# Patient Record
Sex: Female | Born: 1995
Health system: Southern US, Community
[De-identification: ages and names within clinical notes are randomized; demographics above are authoritative.]

## PROBLEM LIST (undated history)

## (undated) DIAGNOSIS — T7840XA Allergy, unspecified, initial encounter: Secondary | ICD-10-CM

## (undated) DIAGNOSIS — F419 Anxiety disorder, unspecified: Secondary | ICD-10-CM

## (undated) HISTORY — DX: Allergy, unspecified, initial encounter: T78.40XA

## (undated) HISTORY — DX: Anxiety disorder, unspecified: F41.9

---
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
## 2016-05-13 DIAGNOSIS — D224 Melanocytic nevi of scalp and neck: Secondary | ICD-10-CM | POA: Diagnosis not present

## 2016-05-13 DIAGNOSIS — D225 Melanocytic nevi of trunk: Secondary | ICD-10-CM | POA: Diagnosis not present

## 2016-06-30 DIAGNOSIS — Z0000 Encounter for general adult medical examination without abnormal findings: Secondary | ICD-10-CM | POA: Diagnosis not present

## 2016-06-30 DIAGNOSIS — Z68.41 Body mass index (BMI) pediatric, 5th percentile to less than 85th percentile for age: Secondary | ICD-10-CM | POA: Diagnosis not present

## 2016-06-30 DIAGNOSIS — Z1389 Encounter for screening for other disorder: Secondary | ICD-10-CM | POA: Diagnosis not present

## 2016-06-30 DIAGNOSIS — Z23 Encounter for immunization: Secondary | ICD-10-CM | POA: Diagnosis not present

## 2016-08-24 DIAGNOSIS — M545 Low back pain: Secondary | ICD-10-CM | POA: Diagnosis not present

## 2016-11-17 DIAGNOSIS — F422 Mixed obsessional thoughts and acts: Secondary | ICD-10-CM | POA: Diagnosis not present

## 2016-11-19 DIAGNOSIS — H1013 Acute atopic conjunctivitis, bilateral: Secondary | ICD-10-CM | POA: Diagnosis not present

## 2016-11-27 DIAGNOSIS — F422 Mixed obsessional thoughts and acts: Secondary | ICD-10-CM | POA: Diagnosis not present

## 2016-12-15 DIAGNOSIS — F422 Mixed obsessional thoughts and acts: Secondary | ICD-10-CM | POA: Diagnosis not present

## 2016-12-16 ENCOUNTER — Ambulatory Visit (INDEPENDENT_AMBULATORY_CARE_PROVIDER_SITE_OTHER): Payer: BLUE CROSS/BLUE SHIELD | Admitting: Family Medicine

## 2016-12-16 ENCOUNTER — Encounter: Payer: Self-pay | Admitting: Family Medicine

## 2016-12-16 VITALS — BP 112/69 | HR 89 | Temp 97.4°F | Ht 65.0 in | Wt 112.0 lb

## 2016-12-16 DIAGNOSIS — L239 Allergic contact dermatitis, unspecified cause: Secondary | ICD-10-CM

## 2016-12-16 MED ORDER — MOMETASONE FUROATE 0.1 % EX CREA: 1.0000 "application " | TOPICAL_CREAM | Freq: Every day | CUTANEOUS | 0 refills | Status: DC

## 2016-12-16 MED ORDER — FEXOFENADINE HCL 180 MG PO TABS: ORAL_TABLET | ORAL | 11 refills | Status: DC

## 2016-12-16 NOTE — Progress Notes (Signed)
Subjective:  Patient ID: Chelsea Fleming, female    DOB: Dec 02, 1995  Age: 21 y.o. MRN: [id number]  CC: New Patient (Initial Visit) (pt here today to establish care. She is also c/o red, itchy and puffy eyes.)   HPI Chelsea Fleming presents for Symptoms noted above. They have been present for 2-3 days. It has not caused any change in visual acuity. The itching has been very uncomfortable. It is primarily present below the left eye.  History Chelsea Fleming has a past medical history of Allergy and Anxiety.   She has no past surgical history on file.   Her family history includes Depression in her mother; Diabetes in her paternal grandfather; Heart disease in her maternal grandfather and paternal grandfather; Hyperlipidemia in her mother.She reports that she has never smoked. She has never used smokeless tobacco. She reports that she does not drink alcohol or use drugs.  No current outpatient prescriptions on file prior to visit.   No current facility-administered medications on file prior to visit.     ROS Review of Systems  Constitutional: Negative for appetite change, chills, diaphoresis, fatigue and fever.  HENT: Negative for congestion, ear pain, hearing loss, postnasal drip, rhinorrhea, sore throat and trouble swallowing.   Eyes: Positive for discharge, redness and itching.  Respiratory: Negative for cough, chest tightness and shortness of breath.   Cardiovascular: Negative for chest pain and palpitations.  Gastrointestinal: Negative for abdominal pain.  Musculoskeletal: Negative for arthralgias.  Skin: Negative for rash.    Objective:  BP 112/69   Pulse 89   Temp 97.4 F (36.3 C) (Oral)   Ht 5\' 5"  (1.651 m)   Wt 112 lb (50.8 kg)   LMP 12/15/2016 (Exact Date)   BMI 18.64 kg/m   Physical Exam  Constitutional: She is oriented to person, place, and time. She appears well-developed and well-nourished. No distress.  Eyes: Right eye exhibits chemosis. Right eye exhibits no exudate.  Left eye exhibits chemosis. Left eye exhibits no exudate. Right conjunctiva is injected. Left conjunctiva is injected.  Cardiovascular: Normal rate and regular rhythm.   Pulmonary/Chest: Breath sounds normal.  Neurological: She is alert and oriented to person, place, and time.  Skin: Skin is warm and dry.  Psychiatric: She has a normal mood and affect.    Assessment & Plan:   Chelsea Fleming was seen today for new patient (initial visit).  Diagnoses and all orders for this visit:  Allergic eczema -     Ambulatory referral to Allergy  Other orders -     fexofenadine (ALLEGRA) 180 MG tablet; One daily to twice daily For allergy symptoms -     mometasone (ELOCON) 0.1 % cream; Apply 1 application topically daily.   I am having [name] start on fexofenadine and mometasone. I am also having her maintain her Norgestimate-Ethinyl Estradiol Triphasic.  Meds ordered this encounter  Medications  . Norgestimate-Ethinyl Estradiol Triphasic (TRI-PREVIFEM) 0.18/0.215/0.25 MG-35 MCG tablet    Sig: TAKE 1 TABLET BY MOUTH EVERY DAY  . fexofenadine (ALLEGRA) 180 MG tablet    Sig: One daily to twice daily For allergy symptoms    Dispense:  60 tablet    Refill:  11  . mometasone (ELOCON) 0.1 % cream    Sig: Apply 1 application topically daily.    Dispense:  45 g    Refill:  0   Your main treatment should be the Allegra that is listed below, along with as needed use of the Elocon that has been prescribed.  Consider using Cetaphil products instead of the Vaseline around your eyes.  Follow-up: Return if symptoms worsen or fail to improve.  Chelsea Fleming, M.D.

## 2016-12-16 NOTE — Patient Instructions (Signed)
It was great to meet you today!  Your diagnosis today he was allergic eczema of the periorbital (around the eye) region.  Your main treatment should be the Allegra that is listed below, along with as needed use of the Elocon that has been prescribed.  Consider using Cetaphil products instead of the Vaseline around your eyes.  You have been referred to an allergist. Please give the office up to 1 week to make those arrangements for you.  Best regards,  Mechele ClaudeWarren Covey Baller M.D.

## 2016-12-26 DIAGNOSIS — F422 Mixed obsessional thoughts and acts: Secondary | ICD-10-CM | POA: Diagnosis not present

## 2017-01-12 DIAGNOSIS — F422 Mixed obsessional thoughts and acts: Secondary | ICD-10-CM | POA: Diagnosis not present

## 2017-01-21 ENCOUNTER — Ambulatory Visit: Payer: BLUE CROSS/BLUE SHIELD | Admitting: Allergy and Immunology

## 2017-01-26 DIAGNOSIS — F422 Mixed obsessional thoughts and acts: Secondary | ICD-10-CM | POA: Diagnosis not present

## 2017-04-05 ENCOUNTER — Telehealth: Payer: Self-pay | Admitting: Family Medicine

## 2017-04-05 NOTE — Telephone Encounter (Signed)
Copied shot records off ncir and put copy up front

## 2017-07-05 ENCOUNTER — Other Ambulatory Visit: Payer: Self-pay | Admitting: Family Medicine

## 2017-07-12 ENCOUNTER — Ambulatory Visit (INDEPENDENT_AMBULATORY_CARE_PROVIDER_SITE_OTHER): Payer: BLUE CROSS/BLUE SHIELD | Admitting: Family Medicine

## 2017-07-12 ENCOUNTER — Encounter: Payer: Self-pay | Admitting: Family Medicine

## 2017-07-12 VITALS — BP 115/68 | HR 69 | Temp 97.8°F | Ht 65.0 in | Wt 108.0 lb

## 2017-07-12 DIAGNOSIS — Z23 Encounter for immunization: Secondary | ICD-10-CM

## 2017-07-12 DIAGNOSIS — Z114 Encounter for screening for human immunodeficiency virus [HIV]: Secondary | ICD-10-CM | POA: Diagnosis not present

## 2017-07-12 DIAGNOSIS — Z130 Encounter for screening for diseases of the blood and blood-forming organs and certain disorders involving the immune mechanism: Secondary | ICD-10-CM | POA: Diagnosis not present

## 2017-07-12 DIAGNOSIS — Z0000 Encounter for general adult medical examination without abnormal findings: Secondary | ICD-10-CM

## 2017-07-12 DIAGNOSIS — Z5181 Encounter for therapeutic drug level monitoring: Secondary | ICD-10-CM | POA: Diagnosis not present

## 2017-07-12 DIAGNOSIS — Z681 Body mass index (BMI) 19 or less, adult: Secondary | ICD-10-CM | POA: Diagnosis not present

## 2017-07-12 DIAGNOSIS — Z3041 Encounter for surveillance of contraceptive pills: Secondary | ICD-10-CM | POA: Diagnosis not present

## 2017-07-12 LAB — PREGNANCY, URINE: PREG TEST UR: NEGATIVE

## 2017-07-12 MED ORDER — NORGESTIM-ETH ESTRAD TRIPHASIC 0.18/0.215/0.25 MG-35 MCG PO TABS: 1.0000 | ORAL_TABLET | Freq: Every day | ORAL | 4 refills | Status: DC

## 2017-07-12 NOTE — Patient Instructions (Signed)
Preventive Care for [place], Female The transition to life after high school as a young adult can be a stressful time with many changes. You may start seeing a primary care physician instead of a pediatrician. This is the time when your health care becomes your responsibility. Preventive care refers to lifestyle choices and visits with your health care provider that can promote health and wellness. What does preventive care include?  A yearly physical exam. This is also called an annual wellness visit.  Dental exams once or twice a year.  Routine eye exams. Ask your health care provider how often you should have your eyes checked.  Personal lifestyle choices, including: ? Daily care of your teeth and gums. ? Regular physical activity. ? Eating a healthy diet. ? Avoiding tobacco and drug use. ? Avoiding or limiting alcohol use. ? Practicing safe sex. ? Taking vitamin and mineral supplements as recommended by your health care provider. What happens during an annual wellness visit? Preventive care starts with a yearly visit to your primary care physician. The services and screenings done by your health care provider during your annual wellness visit will depend on your overall health, lifestyle risk factors, and family history of disease. Counseling Your health care provider may ask you questions about:  Past medical problems and your family's medical history.  Medicines or supplements you take.  Health insurance and access to health care.  Alcohol, tobacco, and drug use.  Your safety at home, work, or school.  Access to firearms.  Emotional well-being and how you cope with stress.  Relationship well-being.  Diet, exercise, and sleep habits.  Your sexual health and activity.  Your methods of birth control.  Your menstrual cycle.  Your pregnancy history.  Screening You may have the following tests or measurements:  Height, weight, and BMI.  Blood  pressure.  Lipid and cholesterol levels.  Tuberculosis skin test.  Skin exam.  Vision and hearing tests.  Screening test for hepatitis.  Screening tests for sexually transmitted diseases (STDs), if you are at risk.  BRCA-related cancer screening. This may be done if you have a family history of breast, ovarian, tubal, or peritoneal cancers.  Pelvic exam and Pap test. This may be done every 3 years starting at age 35.  Vaccines Your health care provider may recommend certain vaccines, such as:  Influenza vaccine. This is recommended every year.  Tetanus, diphtheria, and acellular pertussis (Tdap, Td) vaccine. You may need a Td booster every 10 years.  Varicella vaccine. You may need this if you have not been vaccinated.  HPV vaccine. If you are 25 or younger, you may need three doses over 6 months.  Measles, mumps, and rubella (MMR) vaccine. You may need at least one dose of MMR. You may also need a second dose.  Pneumococcal 13-valent conjugate (PCV13) vaccine. You may need this if you have certain conditions and were not previously vaccinated.  Pneumococcal polysaccharide (PPSV23) vaccine. You may need one or two doses if you smoke cigarettes or if you have certain conditions.  Meningococcal vaccine. One dose is recommended if you are age 24-21 years and a first-year college student living in a residence hall, or if you have one of several medical conditions. You may also need additional booster doses.  Hepatitis A vaccine. You may need this if you have certain conditions or if you travel or work in places where you may be exposed to hepatitis A.  Hepatitis B vaccine. You may need this if  you have certain conditions or if you travel or work in places where you may be exposed to hepatitis B.  Haemophilus influenzae type b (Hib) vaccine. You may need this if you have certain risk factors.  Talk to your health care provider about which screenings and vaccines you need and how  often you need them. What steps can I take to develop healthy behaviors?  Have regular preventive health care visits with your primary care physician and dentist.  Eat a healthy diet.  Drink enough fluid to keep your urine clear or pale yellow.  Stay active. Exercise at least 30 minutes 5 or more days of the week.  Use alcohol responsibly.  Maintain a healthy weight.  Do not use any products that contain nicotine, such as cigarettes, chewing tobacco, and e-cigarettes. If you need help quitting, ask your health care provider.  Do not use drugs.  Practice safe sex.  Use birth control (contraception) to prevent unwanted pregnancy. If you plan to become pregnant, see your health care provider for a pre-conception visit.  Find healthy ways to manage stress. How can I protect myself from injury? Injuries from violence or accidents are the leading cause of death among young adults and can often be prevented. Take these steps to help protect yourself:  Always wear your seat belt while driving or riding in a vehicle.  Do not drive if you have been drinking alcohol. Do not ride with someone who has been drinking.  Do not drive when you are tired or distracted. Do not text while driving.  Wear a helmet and other protective equipment during sports activities.  If you have firearms in your house, make sure you follow all gun safety procedures.  Seek help if you have been bullied, physically abused, or sexually abused.  Use the Internet responsibly to avoid dangers such as online bullying and online sexual predators.  What can I do to cope with stress? Young adults may face many new challenges that can be stressful, such as finding a job, going to college, moving away from home, managing money, being in a relationship, getting married, and having children. To manage stress:  Avoid known stressful situations when you can.  Exercise regularly.  Find a stress-reducing activity that  works best for you. Examples include meditation, yoga, listening to music, or reading.  Spend time in nature.  Keep a journal to write about your stress and how you respond.  Talk to your health care provider about stress. He or she may suggest counseling.  Spend time with supportive friends or family.  Do not cope with stress by: ? Drinking alcohol or using drugs. ? Smoking cigarettes. ? Eating.  Where can I get more information? Learn more about preventive care and healthy habits from:  [place] and Gynecologists: KaraokeExchange.nl  U.S. Probation officer Task Force: StageSync.si  National Adolescent and [place]: StrategicRoad.nl  American Academy of Pediatrics Bright Futures: https://brightfutures.MemberVerification.co.za  Society for Adolescent Health and Medicine: MoralBlog.co.za.aspx  PodExchange.nl: ToyLending.fr  This information is not intended to replace advice given to you by your health care provider. Make sure you discuss any questions you have with your health care provider. Document Released: 02/06/2016 Document Revised: 02/27/2016 Document Reviewed: 02/06/2016 Elsevier Interactive Patient Education  2017 [employer].

## 2017-07-12 NOTE — Assessment & Plan Note (Addendum)
Patient does have a history of OCD behavior. She is seeing a therapist. She admits to being a peak eater on exam today. We'll obtain a vitamin D level, complete metabolic panel, and TSH. We'll call with results. I encouraged healthy lifestyle choices, including balanced diet and regular exercise.

## 2017-07-12 NOTE — Progress Notes (Signed)
Chelsea Fleming is a 21 y.o. female presents to office today for annual physical exam examination.    Concerns today include: 1. Contraception Previous contraception: Tri-PreviFem.  LMP 07/01/17.  Last unprotected intercourse 07/12/17. No h/o STIs.  No vaginal discharge, abnormal vaginal bleeding, dyspareunia, fevers, chills, abdominal pain, dysuria.  No h/o DVT/ PE, clotting disorders, migraine headache.  Non smoker.  Occupation: Corporate treasurer, Marital status: boyfriend, longeterm, Substance use: none Diet: picky eater, takes a MVI. Exercise: regular Last eye exam: >1 year Last dental exam: q6 months Refills needed today: OCPs Immunizations needed: Flu Vaccine: yes ; TDap within the last 10 years  Past Medical History:  Diagnosis Date  . Allergy   . Anxiety    OCD-seeing counselor in [employer]   Social History   Social History  . Marital status: Single    Spouse name: N/A  . Number of children: N/A  . Years of education: N/A   Occupational History  . Not on file.   Social History Main Topics  . Smoking status: Never Smoker  . Smokeless tobacco: Never Used  . Alcohol use No  . Drug use: No  . Sexual activity: Yes    Partners: Male    Birth control/ protection: Pill, Condom   Other Topics Concern  . Not on file   Social History Narrative  . No narrative on file   History reviewed. No pertinent surgical history. Family History  Problem Relation Age of Onset  . Depression Mother   . Hyperlipidemia Mother   . Hypertension Mother   . Depression Sister   . Heart disease Maternal Grandfather   . Heart attack Maternal Grandfather 59  . Diabetes Paternal Grandfather   . Heart disease Paternal Grandfather   . Hypertension Paternal Grandfather     Current Outpatient Prescriptions:  .  fexofenadine (ALLEGRA) 180 MG tablet, One daily to twice daily For allergy symptoms, Disp: 60 tablet, Rfl: 11 .  mometasone (ELOCON) 0.1 % cream, Apply 1 application  topically daily., Disp: 45 g, Rfl: 0 .  Multiple Vitamin (MULTIVITAMIN WITH MINERALS) TABS tablet, Take 1 tablet by mouth daily., Disp: , Rfl:  .  Norgestimate-Ethinyl Estradiol Triphasic (TRI-PREVIFEM) 0.18/0.215/0.25 MG-35 MCG tablet, Take 1 tablet by mouth daily., Disp: 3 Package, Rfl: 4   ROS: Review of Systems Constitutional: negative Eyes: negative Ears, nose, mouth, throat, and face: positive for seasonal allergies Respiratory: negative Cardiovascular: negative Gastrointestinal: negative Genitourinary:negative Integument/breast: negative Hematologic/lymphatic: negative Musculoskeletal:negative Neurological: negative Behavioral/Psych: positive for anxiety and OCD behavior.  She sees a therapist for this. Endocrine: negative Allergic/Immunologic: positive for seasonal allergies, worst in the winter    Physical exam BP 115/68   Pulse 69   Temp 97.8 F (36.6 C) (Oral)   Ht '5\' 5"'$  (1.651 m)   Wt 108 lb (49 kg)   LMP 06/29/2017   BMI 17.97 kg/m  General appearance: alert, cooperative, appears stated age, no distress and thin Head: Normocephalic, without obvious abnormality, atraumatic Eyes: negative findings: lids and lashes normal, conjunctivae and sclerae normal, corneas clear and pupils equal, round, reactive to light and accomodation Ears: normal TM's and external ear canals both ears Nose: Nares normal. Septum midline. Mucosa normal. No drainage or sinus tenderness. Throat: lips, mucosa, and tongue normal; teeth and gums normal Neck: no adenopathy, no JVD, supple, symmetrical, trachea midline and thyroid not enlarged, symmetric, no tenderness/mass/nodules Back: symmetric, no curvature. ROM normal. No CVA tenderness. Lungs: clear to auscultation bilaterally Heart: regular rate and rhythm, S1,  S2 normal, no murmur, click, rub or gallop Abdomen: soft, non-tender; bowel sounds normal; no masses,  no organomegaly Extremities: extremities normal, atraumatic, no cyanosis or  edema Pulses: 2+ and symmetric Skin: Skin color, texture, turgor normal. No rashes or lesions Lymph nodes: Cervical, supraclavicular, and axillary nodes normal. Neurologic: Alert and oriented X 3, normal strength and tone. Normal symmetric reflexes. Normal coordination and gait    Assessment/ Plan: Chelsea Fleming here for annual physical exam.   BMI less than 19,adult Patient does have a history of OCD behavior. She is seeing a therapist. She admits to being a peak eater on exam today. We'll obtain a vitamin D level, complete metabolic panel, and TSH. We'll call with results. I encouraged healthy lifestyle choices, including balanced diet and regular exercise.  Encounter for surveillance of contraceptive pills Urine pregnancy negative. Refills provided for 1 year for oral contraceptive pills. We did review other forms of birth control. Patient may be interested in potentially having a LARC placed. She will follow up as needed regarding this. - Pregnancy, urine - GC/Chlamydia Probe Amp  Annual physical exam Influenza vaccine updated today. She will be due for her first Pap smear next year. Complete metabolic panel, CBC, screening for HIV at low risk patient, screening for GC/CT, vitamin D and TSH ordered today.  Medication monitoring encounter - CMP14+EGFR  Screening for HIV without presence of risk factors - HIV antibody (with reflex)  Screening for deficiency anemia - CBC  Counseled on healthy lifestyle choices, including diet (rich in fruits, vegetables and lean meats and low in salt and simple carbohydrates) and exercise (at least 30 minutes of moderate physical activity daily).  Patient to follow up in 1 year for annual exam or sooner if needed.  Chelsea Vickroy M. Lajuana Ripple, DO

## 2017-07-13 LAB — CMP14+EGFR
A/G RATIO: 1.6 (ref 1.2–2.2)
ALT: 17 IU/L (ref 0–32)
AST: 19 IU/L (ref 0–40)
Albumin: 4.6 g/dL (ref 3.5–5.5)
Alkaline Phosphatase: 45 IU/L (ref 39–117)
BUN/Creatinine Ratio: 12 (ref 9–23)
BUN: 9 mg/dL (ref 6–20)
Bilirubin Total: 0.6 mg/dL (ref 0.0–1.2)
CALCIUM: 9.5 mg/dL (ref 8.7–10.2)
CO2: 23 mmol/L (ref 20–29)
CREATININE: 0.78 mg/dL (ref 0.57–1.00)
Chloride: 101 mmol/L (ref 96–106)
GFR, EST AFRICAN AMERICAN: 127 mL/min/{1.73_m2} (ref >59)
GFR, EST NON AFRICAN AMERICAN: 110 mL/min/{1.73_m2} (ref >59)
Globulin, Total: 2.9 g/dL (ref 1.5–4.5)
Glucose: 59 mg/dL — ABNORMAL LOW (ref 65–99)
POTASSIUM: 4.4 mmol/L (ref 3.5–5.2)
Sodium: 139 mmol/L (ref 134–144)
TOTAL PROTEIN: 7.5 g/dL (ref 6.0–8.5)

## 2017-07-13 LAB — CBC
HEMOGLOBIN: 13.7 g/dL (ref 11.1–15.9)
Hematocrit: 39.9 % (ref 34.0–46.6)
MCH: 29.7 pg (ref 26.6–33.0)
MCHC: 34.3 g/dL (ref 31.5–35.7)
MCV: 86 fL (ref 79–97)
Platelets: 252 10*3/uL (ref 150–379)
RBC: 4.62 x10E6/uL (ref 3.77–5.28)
RDW: 12.6 % (ref 12.3–15.4)
WBC: 4.3 10*3/uL (ref 3.4–10.8)

## 2017-07-13 LAB — HIV ANTIBODY (ROUTINE TESTING W REFLEX): HIV Screen 4th Generation wRfx: NONREACTIVE

## 2017-07-13 LAB — TSH: TSH: 1.36 u[IU]/mL (ref 0.450–4.500)

## 2017-07-13 LAB — VITAMIN D 25 HYDROXY (VIT D DEFICIENCY, FRACTURES): VIT D 25 HYDROXY: 59.9 ng/mL (ref 30.0–100.0)

## 2017-07-14 LAB — GC/CHLAMYDIA PROBE AMP
Chlamydia trachomatis, NAA: NEGATIVE
Neisseria gonorrhoeae by PCR: NEGATIVE

## 2017-08-18 ENCOUNTER — Telehealth: Payer: Self-pay | Admitting: Family Medicine

## 2017-08-18 NOTE — Telephone Encounter (Signed)
Pt had all four Wisdom teeth pulled  on 11/9. Was put to sleep. The second and third night she was sick on stomach from hydrocodone. So she stopped that. She was put on clindamycin and sudafed. She states she felt very shake and out of it . She states she cant feel head or the back of her neck. She has already called the surgeon and they have not got back with her. Please advise.

## 2017-08-18 NOTE — Telephone Encounter (Signed)
Pt notified of recommendation Surgeon has contacted pt

## 2017-08-18 NOTE — Telephone Encounter (Signed)
Keep calliing her oral surgeon

## 2017-08-25 ENCOUNTER — Ambulatory Visit (INDEPENDENT_AMBULATORY_CARE_PROVIDER_SITE_OTHER): Payer: BLUE CROSS/BLUE SHIELD | Admitting: Family Medicine

## 2017-08-25 ENCOUNTER — Encounter: Payer: Self-pay | Admitting: Family Medicine

## 2017-08-25 VITALS — BP 130/74 | HR 78 | Temp 98.9°F | Ht 65.0 in | Wt 107.0 lb

## 2017-08-25 DIAGNOSIS — F422 Mixed obsessional thoughts and acts: Secondary | ICD-10-CM | POA: Diagnosis not present

## 2017-08-25 DIAGNOSIS — F411 Generalized anxiety disorder: Secondary | ICD-10-CM

## 2017-08-25 MED ORDER — SERTRALINE HCL 50 MG PO TABS: 50.0000 mg | ORAL_TABLET | Freq: Every day | ORAL | 0 refills | Status: DC

## 2017-08-25 MED ORDER — HYDROXYZINE HCL 50 MG PO TABS: 25.0000 mg | ORAL_TABLET | Freq: Three times a day (TID) | ORAL | 0 refills | Status: DC | PRN

## 2017-08-25 NOTE — Progress Notes (Signed)
Subjective: CC: OCD PCP: Mechele ClaudeStacks, Warren, MD ZOX:WRUEAVHPI:Chelsea Fleming is a 21 y.o. female presenting to clinic today for:  1. OCD/ Panic attacks Patient reports that she was diagnosed with obsessive-compulsive disorder around age 89 by her family physician.  She reports that she was prescribed medications for this at that age but never took them.  She describes obsessive-compulsive actions as repetitive praying, especially during stressful situations.  She notes that she actually had physical manifestations of obsessive-compulsive disorder prior to undergoing cognitive behavioral therapy with a provider in [place].  She notes last visit with that provider was this summer.  She found this to be extremely helpful.  Prior to cognitive behavioral therapy she was excessively handwashing, having to turn doorknobs a certain way, having to be dressed and wear her watch in certain ways., having to do things in certain orders.  She notes that when she graduated high school and went off to [place] that symptoms seem to have gotten worse.  She was only able to stay at college for 1 month before having to leave due to panic attacks which she describes as chest tightening and feeling short of breath.  She is planning to go to [employer] [place] in January and is worried that she will start having recurrent panic attacks and worsening of her obsessive compulsive disorder.  She is wanting to initiate medications in anticipation for a more stressful environment.  She notes a family history of possible bipolar depression in her mother who required psychiatric admission twice in her lifetime.  She also notes her sister has anxiety disorder and is well controlled on Zoloft and BuSpar.  She is somewhat worried about taking medications like Zoloft because they caused some weight gain and her sister.  She denies suicidal ideation, homicidal ideation, depressive symptoms, visual hallucinations.  She has never had to be hospitalized for  mental health disorder.  No history of substance use or abuse.  Allergies  Allergen Reactions  . Penicillin G Rash   Past Medical History:  Diagnosis Date  . Allergy   . Anxiety    OCD-seeing counselor in [place]   Family History  Problem Relation Age of Onset  . Depression Mother   . Hyperlipidemia Mother   . Hypertension Mother   . Depression Sister   . Heart disease Maternal Grandfather   . Heart attack Maternal Grandfather 59  . Diabetes Paternal Grandfather   . Heart disease Paternal Grandfather   . Hypertension Paternal Grandfather     Current Outpatient Medications:  .  fexofenadine (ALLEGRA) 180 MG tablet, One daily to twice daily For allergy symptoms, Disp: 60 tablet, Rfl: 11 .  mometasone (ELOCON) 0.1 % cream, Apply 1 application topically daily., Disp: 45 g, Rfl: 0 .  Multiple Vitamin (MULTIVITAMIN WITH MINERALS) TABS tablet, Take 1 tablet by mouth daily., Disp: , Rfl:  .  Norgestimate-Ethinyl Estradiol Triphasic (TRI-PREVIFEM) 0.18/0.215/0.25 MG-35 MCG tablet, Take 1 tablet by mouth daily., Disp: 3 Package, Rfl: 4  Social Hx: non smoker.  ROS: Per HPI  Objective: Office vital signs reviewed. BP 130/74   Pulse 78   Temp 98.9 F (37.2 C) (Oral)   Ht 5\' 5"  (1.651 m)   Wt 107 lb (48.5 kg)   BMI 17.81 kg/m   Physical Examination:  General: Awake, alert, thin female, No acute distress HEENT: Sclera white, MMM Cardio: regular rate and rhythm, S1S2 heard, no murmurs appreciated Pulm: clear to auscultation bilaterally, no wheezes, rhonchi or rales; normal work of  breathing on room air Psych: Mood stable, speech normal, affect appropriate, good eye contact, pleasant, does not appear to be responding to internal stimuli.  [facility] 08/25/2017 12/16/2016  Decreased Interest 0 0  Down, Depressed, Hopeless 0 0  PHQ - 2 Score 0 0   GAD 7 : Generalized Anxiety Score 08/25/2017  Nervous, Anxious, on Edge 3  Control/stop worrying 1    Worry too much - different things 1  Trouble relaxing 1  Restless 1  Easily annoyed or irritable 3  Afraid - awful might happen 3  Total GAD 7 Score 13  Anxiety Difficulty Somewhat difficult   Mood questionnaire:  Part 1: Patient answered yes to questions 1,2,6,7 Part 2: Patient answered no Part 3: Patient answered minor problem Part 4: Patient answered yes Part 5: Patient answered no  Assessment/ Plan: 21 y.o. female   Mixed obsessional thoughts and acts Diagnosed as a child by primary care doctor.  She is done well with cognitive behavioral therapy and I encouraged her to establish with a therapist on campus as soon as she arrives.  I think that she would be better served by having a therapist on campus rather than having to drive back home for sessions.  Given her sister's good response to Zoloft, I did recommend that she consider starting this medication for OCD and generalized anxiety disorder.  Patient is slightly underweight so I think that any weight gain would likely be insignificant.  Should she want to pursue an alternative, we did discuss Prozac today.  Her PHQ 9 score was 0 today.  Her gad 7 score was 13.  Her mood questionnaire was negative for evidence of bipolar or mood disorder.  I have also prescribed her hydroxyzine.  She may take 25-50 mg of this every 8 hours as needed for panic attack.  Given her rare panic attacks, she will likely not have to use this very often.  I did caution sedation with this medication.  Handout was provided.  She will come back for reevaluation in about 3 weeks.  We will talk about titration of medication at that time if needed. Strict return precautions and reasons for emergent evaluation in the emergency department review with patient.  They voiced understanding and will follow-up as needed.   GAD (generalized anxiety disorder) Initiate SSRI, patient has been prescribed hydroxyzine as needed, and she will establish with a therapist on campus at  college.  Gad 7 score 13 today.  Follow up in 1 month   Meds ordered this encounter  Medications  . sertraline (ZOLOFT) 50 MG tablet    Sig: Take 1 tablet (50 mg total) by mouth daily.    Dispense:  30 tablet    Refill:  0  . hydrOXYzine (ATARAX/VISTARIL) 50 MG tablet    Sig: Take 0.5-1 tablets (25-50 mg total) by mouth 3 (three) times daily as needed for anxiety.    Dispense:  30 tablet    Refill:  0   Total time spent with patient 29 minutes.  Greater than 50% of encounter spent in coordination of care/counseling.   Raliegh IpAshly M Bonnita Newby, DO [facility] [phone]

## 2017-08-25 NOTE — Patient Instructions (Addendum)
I have sent you an Zoloft 50 mg to take once daily.  The most common side effect of Zoloft is GI disturbance. I have also prescribed hydroxyzine.  You may take half a tablet to one full tablet up to 3 times daily if needed for panic attacks.  As we discussed, the hydroxyzine can cause sedation and dry mouth.  Taking the medicine as directed and not missing any doses is one of the best things you can do to treat your depression.  Here are some things to keep in mind:  1) Side effects (stomach upset, some increased anxiety) may happen before you notice a benefit.  These side effects typically go away over time. 2) Changes to your dose of medicine or a change in medication all together is sometimes necessary 3) Most people need to be on medication at least 12 months 4) Many people will notice an improvement within two weeks but the full effect of the medication can take up to 4-6 weeks 5) Stopping the medication when you start feeling better often results in a return of symptoms 6) Never discontinue your medication without contacting a health care professional first.  Some medications require gradual discontinuation/ taper and can make you sick if you stop them abruptly.  If your symptoms worsen or you have thoughts of suicide/homicide, PLEASE SEEK IMMEDIATE MEDICAL ATTENTION.  You may always call the National Suicide Hotline.  This is available 24 hours a day, 7 days a week.  Their phone number is: [phone]  Follow-up in 3-4 weeks with me.

## 2017-08-25 NOTE — Progress Notes (Signed)
o

## 2017-08-25 NOTE — Assessment & Plan Note (Signed)
Initiate SSRI, patient has been prescribed hydroxyzine as needed, and she will establish with a therapist on campus at college.  Gad 7 score 13 today.  Follow up in 1 month

## 2017-08-25 NOTE — Assessment & Plan Note (Signed)
Diagnosed as a child by primary care doctor.  She is done well with cognitive behavioral therapy and I encouraged her to establish with a therapist on campus as soon as she arrives.  I think that she would be better served by having a therapist on campus rather than having to drive back home for sessions.  Given her sister's good response to Zoloft, I did recommend that she consider starting this medication for OCD and generalized anxiety disorder.  Patient is slightly underweight so I think that any weight gain would likely be insignificant.  Should she want to pursue an alternative, we did discuss Prozac today.  Her PHQ 9 score was 0 today.  Her gad 7 score was 13.  Her mood questionnaire was negative for evidence of bipolar or mood disorder.  I have also prescribed her hydroxyzine.  She may take 25-50 mg of this every 8 hours as needed for panic attack.  Given her rare panic attacks, she will likely not have to use this very often.  I did caution sedation with this medication.  Handout was provided.  She will come back for reevaluation in about 3 weeks.  We will talk about titration of medication at that time if needed. Strict return precautions and reasons for emergent evaluation in the emergency department review with patient.  They voiced understanding and will follow-up as needed.

## 2017-09-15 ENCOUNTER — Ambulatory Visit (INDEPENDENT_AMBULATORY_CARE_PROVIDER_SITE_OTHER): Payer: BLUE CROSS/BLUE SHIELD | Admitting: Family Medicine

## 2017-09-15 ENCOUNTER — Encounter: Payer: Self-pay | Admitting: Family Medicine

## 2017-09-15 VITALS — BP 114/65 | HR 80 | Temp 98.1°F | Ht 65.0 in | Wt 112.0 lb

## 2017-09-15 DIAGNOSIS — F422 Mixed obsessional thoughts and acts: Secondary | ICD-10-CM | POA: Diagnosis not present

## 2017-09-15 DIAGNOSIS — F411 Generalized anxiety disorder: Secondary | ICD-10-CM

## 2017-09-15 MED ORDER — SERTRALINE HCL 50 MG PO TABS: 50.0000 mg | ORAL_TABLET | Freq: Every day | ORAL | 1 refills | Status: DC

## 2017-09-15 NOTE — Progress Notes (Signed)
Subjective: CC: follow up OCD/ GAD HPI: Chelsea Fleming is a 21 y.o. female presenting to clinic today for:  1. OCD/GAD Patient was seen August 25, 2017 for OCD/GAD.  She was started on Zoloft 50 mg daily and instructed to follow-up in 2-3 weeks for symptoms.  Today she reports that symptoms are dramatically better than last visit.  She notes that her mood is much more stable and she worries much less.  She does feel that medication does cause her to be a little bit sleepier than normal.  She specifically cites that she goes to bed around 8:00 now instead of 9:30.  She is otherwise tolerating medication really well.  Nausea has completely resolved.  She has not required the hydroxyzine at all for symptoms.  She will be starting at her new school in January.  She has not yet established with a therapist there but is planning to meet with the campus therapist when she arrives.  ROS: Per HPI  Past Medical History:  Diagnosis Date  . Allergy   . Anxiety    [place]   Allergies  Allergen Reactions  . Penicillin G Rash    Current Outpatient Medications:  .  fexofenadine (ALLEGRA) 180 MG tablet, One daily to twice daily For allergy symptoms, Disp: 60 tablet, Rfl: 11 .  hydrOXYzine (ATARAX/VISTARIL) 50 MG tablet, Take 0.5-1 tablets (25-50 mg total) by mouth 3 (three) times daily as needed for anxiety., Disp: 30 tablet, Rfl: 0 .  mometasone (ELOCON) 0.1 % cream, Apply 1 application topically daily., Disp: 45 g, Rfl: 0 .  Multiple Vitamin (MULTIVITAMIN WITH MINERALS) TABS tablet, Take 1 tablet by mouth daily., Disp: , Rfl:  .  Norgestimate-Ethinyl Estradiol Triphasic (TRI-PREVIFEM) 0.18/0.215/0.25 MG-35 MCG tablet, Take 1 tablet by mouth daily., Disp: 3 Package, Rfl: 4 .  sertraline (ZOLOFT) 50 MG tablet, Take 1 tablet (50 mg total) by mouth daily., Disp: 30 tablet, Rfl: 0 Social History   Socioeconomic History  . Marital status: Single    Spouse name: Not  on file  . Number of children: Not on file  . Years of education: Not on file  . Highest education level: Not on file  Social Needs  . Financial resource strain: Not on file  . Food insecurity - worry: Not on file  . Food insecurity - inability: Not on file  . Transportation needs - medical: Not on file  . Transportation needs - non-medical: Not on file  Occupational History  . Not on file  Tobacco Use  . Smoking status: Never Smoker  . Smokeless tobacco: Never Used  Substance and Sexual Activity  . Alcohol use: No  . Drug use: No  . Sexual activity: Yes    Partners: Male    Birth control/protection: Pill, Condom  Other Topics Concern  . Not on file  Social History Narrative  . Not on file   Family History  Problem Relation Age of Onset  . Depression Mother   . Hyperlipidemia Mother   . Hypertension Mother   . Depression Sister   . Heart disease Maternal Grandfather   . Heart attack Maternal Grandfather 59  . Diabetes Paternal Grandfather   . Heart disease Paternal Grandfather   . Hypertension Paternal Grandfather    Objective: Office vital signs reviewed. BP 114/65   Pulse 80   Temp 98.1 F (36.7 C) (Oral)   Ht 5\' 5"  (1.651 m)   Wt 112 lb (50.8 kg)   BMI  18.64 kg/m   Physical Examination:  General: Awake, alert, thin female, well appearing, No acute distress Psych: Mood stable, speech normal, affect appropriate, good eye contact, pleasant Depression screen [facility] 2/9 09/15/2017 08/25/2017 12/16/2016  Decreased Interest 0 0 0  Down, Depressed, Hopeless 0 0 0  PHQ - 2 Score 0 0 0   GAD 7 : Generalized Anxiety Score 09/15/2017 08/25/2017  Nervous, Anxious, on Edge 0 3  Control/stop worrying 0 1  Worry too much - different things 0 1  Trouble relaxing 0 1  Restless 0 1  Easily annoyed or irritable 1 3  Afraid - awful might happen 0 3  Total GAD 7 Score 1 13  Anxiety Difficulty - Somewhat difficult    Assessment/ Plan: 21 y.o. female   Mixed obsessional  thoughts and acts Subjectively doing much better than previous exam.  Continue Zoloft 50 mg.  I recommended that she consider pushing the dose closer to bedtime in efforts to reduce daytime sleepiness.  6 months of refills were prescribed today.  She will establish with a therapist on campus.  she will follow-up with me in the next 3-4 months.  GAD (generalized anxiety disorder) Greatly improved compared to last visit.  Her gad 7 score has gone from a 13 down to a 1.  Continue Zoloft 50 mg daily.   Raliegh IpAshly M Magic Mohler, DO [facility] [phone]

## 2017-09-15 NOTE — Assessment & Plan Note (Signed)
Greatly improved compared to last visit.  Her gad 7 score has gone from a 13 down to a 1.  Continue Zoloft 50 mg daily.

## 2017-09-15 NOTE — Assessment & Plan Note (Signed)
Subjectively doing much better than previous exam.  Continue Zoloft 50 mg.  I recommended that she consider pushing the dose closer to bedtime in efforts to reduce daytime sleepiness.  6 months of refills were prescribed today.  She will establish with a therapist on campus.  she will follow-up with me in the next 3-4 months.

## 2017-12-26 DIAGNOSIS — B349 Viral infection, unspecified: Secondary | ICD-10-CM | POA: Diagnosis not present

## 2018-02-03 ENCOUNTER — Encounter: Payer: Self-pay | Admitting: Family Medicine

## 2018-02-09 NOTE — Progress Notes (Signed)
Subjective: CC: GAD/ STI testing/ pap HPI: Chelsea Fleming is a 22 y.o. female presenting to clinic today for:  1.  GAD/OCD Patient last seen in December 2018 for generalized anxiety disorder and OCD.  She had reported good control of symptoms on Zoloft 50 mg daily.  She reported rare use of hydroxyzine.  She started a new school in January and notes that things have been going well at school.  She is currently studying public health and enjoying classes at [employer].  She notes that she did have to use hydroxyzine a couple of times when she first started but she has been doing well since that time.  She does have occasional nonbloody diarrhea related to stress but this always resolves.  2. STI testing/ pap smear Patient has never had a Pap smear before.  She is sexually active with 1 female partner.  Denies abnormal vaginal discharge, vaginal lesions, postcoital bleeding, pain with intercourse, vaginal itching, dysuria, hematuria.  She is currently menstruating.Denies History of STIs. Patient's last menstrual period was 02/11/2018 (exact date).  3. Low back pain Per patient's report this is a chronic issue for her.  Pain is nonradiating.  She notes that she had x-rays about 2 years ago of the low back which were unremarkable.  She was prescribed Naprosyn and Robaxin at that time, which helped.  She notes that she had a flare recently that resolved after getting a massage.  She is wondering if she should have Naprosyn on hand for future flares.  No red flag signs, including urinary retention, fecal incontinence or saddle anesthesia.  ROS: Per HPI  Past Medical History:  Diagnosis Date  . Allergy   . Anxiety    OCD-seeing counselor in [facility]   Allergies  Allergen Reactions  . Penicillin G Rash    Current Outpatient Medications:  .  fexofenadine (ALLEGRA) 180 MG tablet, One daily to twice daily For allergy symptoms, Disp: 60 tablet, Rfl: 11 .  hydrOXYzine (ATARAX/VISTARIL) 50 MG  tablet, Take 0.5-1 tablets (25-50 mg total) by mouth 3 (three) times daily as needed for anxiety., Disp: 30 tablet, Rfl: 0 .  mometasone (ELOCON) 0.1 % cream, Apply 1 application topically daily., Disp: 45 g, Rfl: 0 .  Multiple Vitamin (MULTIVITAMIN WITH MINERALS) TABS tablet, Take 1 tablet by mouth daily., Disp: , Rfl:  .  Norgestimate-Ethinyl Estradiol Triphasic (TRI-PREVIFEM) 0.18/0.215/0.25 MG-35 MCG tablet, Take 1 tablet by mouth daily., Disp: 3 Package, Rfl: 4 .  sertraline (ZOLOFT) 50 MG tablet, Take 1 tablet (50 mg total) by mouth daily., Disp: 90 tablet, Rfl: 1 Social History   Socioeconomic History  . Marital status: Single    Spouse name: Not on file  . Number of children: Not on file  . Years of education: Not on file  . Highest education level: Not on file  Occupational History  . Not on file  Social Needs  . Financial resource strain: Not on file  . Food insecurity:    Worry: Not on file    Inability: Not on file  . Transportation needs:    Medical: Not on file    Non-medical: Not on file  Tobacco Use  . Smoking status: Never Smoker  . Smokeless tobacco: Never Used  Substance and Sexual Activity  . Alcohol use: No  . Drug use: No  . Sexual activity: Yes    Partners: Male    Birth control/protection: Pill, Condom  Lifestyle  . Physical activity:    Days per  week: Not on file    Minutes per session: Not on file  . Stress: Not on file  Relationships  . Social connections:    Talks on phone: Not on file    Gets together: Not on file    Attends religious service: Not on file    Active member of club or organization: Not on file    Attends meetings of clubs or organizations: Not on file    Relationship status: Not on file  . Intimate partner violence:    Fear of current or ex partner: Not on file    Emotionally abused: Not on file    Physically abused: Not on file    Forced sexual activity: Not on file  Other Topics Concern  . Not on file  Social History  Narrative  . Not on file   Family History  Problem Relation Age of Onset  . Depression Mother   . Hyperlipidemia Mother   . Hypertension Mother   . Depression Sister   . Heart disease Maternal Grandfather   . Heart attack Maternal Grandfather 59  . Diabetes Paternal Grandfather   . Heart disease Paternal Grandfather   . Hypertension Paternal Grandfather     Health Maintenance: Td  Objective: Office vital signs reviewed. BP 106/66   Pulse (!) 59   Temp (!) 97.1 F (36.2 C) (Oral)   Ht  (1.6 m)   Wt 112 lb (50.8 kg)   LMP 02/11/2018 (Exact Date)   BMI 19.84 kg/m   Physical Examination:  General: Awake, alert, well appearing, No acute distress HEENT: Normal    Eyes: PERRLA, extraocular movement in tact, sclera white    Throat: moist mucus membranes Pulm: normal work of breathing on room air GU: external vaginal tissue normal, cervix retroverted and slightly to patient's right, no punctate lesions on cervix appreciated, moderate brown/bloody discharge from cervical os, no cervical motion tenderness, no abdominal/ adnexal masses MSK: normal gait and normal station Psych: Mood stable, speech normal, affect appropriate, pleasant, interactive.  Depression screen [facility] 2/9 02/11/2018 09/15/2017 08/25/2017  Decreased Interest 0 0 0  Down, Depressed, Hopeless 0 0 0  PHQ - 2 Score 0 0 0   GAD 7 : Generalized Anxiety Score 02/11/2018 09/15/2017 08/25/2017  Nervous, Anxious, on Edge 0 0 3  Control/stop worrying 0 0 1  Worry too much - different things 0 0 1  Trouble relaxing 0 0 1  Restless 0 0 1  Easily annoyed or irritable 0 1 3  Afraid - awful might happen 0 0 3  Total GAD 7 Score 0 1 13  Anxiety Difficulty Not difficult at all - Somewhat difficult    Assessment/ Plan: 22 y.o. female   GAD (generalized anxiety disorder) Well-controlled on Zoloft 50 mg and hydroxyzine as needed.  Her gad 7 score was 0.  Her PHQ 9 score was 0.  She is adjusting to her new school well.   I have sent in a full years worth of refills.  She will contact me if she has any needs.  Plan to see her around December.  Mixed obsessional thoughts and acts  Body mass index (BMI) 19.9 or less, adult  Screening for cervical cancer First Pap.  No previous for comparison.  Will check for STIs per patient's request as well.  HPV reflex if indicated.  Will contact the results once they are available. - Pap Lb, Ct-Ng TV rfx HPV ASCU  Chronic bilateral low back pain without sciatica  Intermittent in nature likely muscular in nature.  Not currently in flare.  Previously well controlled with naproxen 500 mg and Robaxin as needed.  I have given the patient a written prescription for naproxen to use as needed.  She will contact me if symptoms are uncontrolled.  Need for tetanus booster Td administered today.   Raliegh Ip, DO [facility] [phone]

## 2018-02-11 ENCOUNTER — Ambulatory Visit (INDEPENDENT_AMBULATORY_CARE_PROVIDER_SITE_OTHER): Payer: BLUE CROSS/BLUE SHIELD | Admitting: Family Medicine

## 2018-02-11 VITALS — BP 106/66 | HR 59 | Temp 97.1°F | Ht 63.0 in | Wt 112.0 lb

## 2018-02-11 DIAGNOSIS — M545 Low back pain, unspecified: Secondary | ICD-10-CM | POA: Insufficient documentation

## 2018-02-11 DIAGNOSIS — Z124 Encounter for screening for malignant neoplasm of cervix: Secondary | ICD-10-CM

## 2018-02-11 DIAGNOSIS — G8929 Other chronic pain: Secondary | ICD-10-CM

## 2018-02-11 DIAGNOSIS — Z681 Body mass index (BMI) 19 or less, adult: Secondary | ICD-10-CM

## 2018-02-11 DIAGNOSIS — F411 Generalized anxiety disorder: Secondary | ICD-10-CM | POA: Diagnosis not present

## 2018-02-11 DIAGNOSIS — F422 Mixed obsessional thoughts and acts: Secondary | ICD-10-CM | POA: Diagnosis not present

## 2018-02-11 DIAGNOSIS — Z113 Encounter for screening for infections with a predominantly sexual mode of transmission: Secondary | ICD-10-CM | POA: Diagnosis not present

## 2018-02-11 DIAGNOSIS — Z23 Encounter for immunization: Secondary | ICD-10-CM | POA: Diagnosis not present

## 2018-02-11 MED ORDER — NAPROXEN 500 MG PO TABS: 500.0000 mg | ORAL_TABLET | Freq: Two times a day (BID) | ORAL | 0 refills | Status: DC

## 2018-02-11 MED ORDER — SERTRALINE HCL 50 MG PO TABS: 50.0000 mg | ORAL_TABLET | Freq: Every day | ORAL | 4 refills | Status: DC

## 2018-02-11 NOTE — Patient Instructions (Signed)
Preventive Care for [place], Female The transition to life after high school as a young adult can be a stressful time with many changes. You may start seeing a primary care physician instead of a pediatrician. This is the time when your health care becomes your responsibility. Preventive care refers to lifestyle choices and visits with your health care provider that can promote health and wellness. What does preventive care include?  A yearly physical exam. This is also called an annual wellness visit.  Dental exams once or twice a year.  Routine eye exams. Ask your health care provider how often you should have your eyes checked.  Personal lifestyle choices, including: ? Daily care of your teeth and gums. ? Regular physical activity. ? Eating a healthy diet. ? Avoiding tobacco and drug use. ? Avoiding or limiting alcohol use. ? Practicing safe sex. ? Taking vitamin and mineral supplements as recommended by your health care provider. What happens during an annual wellness visit? Preventive care starts with a yearly visit to your primary care physician. The services and screenings done by your health care provider during your annual wellness visit will depend on your overall health, lifestyle risk factors, and family history of disease. Counseling Your health care provider may ask you questions about:  Past medical problems and your family's medical history.  Medicines or supplements you take.  Health insurance and access to health care.  Alcohol, tobacco, and drug use.  Your safety at home, work, or school.  Access to firearms.  Emotional well-being and how you cope with stress.  Relationship well-being.  Diet, exercise, and sleep habits.  Your sexual health and activity.  Your methods of birth control.  Your menstrual cycle.  Your pregnancy history.  Screening You may have the following tests or measurements:  Height, weight, and BMI.  Blood  pressure.  Lipid and cholesterol levels.  Tuberculosis skin test.  Skin exam.  Vision and hearing tests.  Screening test for hepatitis.  Screening tests for sexually transmitted diseases (STDs), if you are at risk.  BRCA-related cancer screening. This may be done if you have a family history of breast, ovarian, tubal, or peritoneal cancers.  Pelvic exam and Pap test. This may be done every 3 years starting at age 35.  Vaccines Your health care provider may recommend certain vaccines, such as:  Influenza vaccine. This is recommended every year.  Tetanus, diphtheria, and acellular pertussis (Tdap, Td) vaccine. You may need a Td booster every 10 years.  Varicella vaccine. You may need this if you have not been vaccinated.  HPV vaccine. If you are 25 or younger, you may need three doses over 6 months.  Measles, mumps, and rubella (MMR) vaccine. You may need at least one dose of MMR. You may also need a second dose.  Pneumococcal 13-valent conjugate (PCV13) vaccine. You may need this if you have certain conditions and were not previously vaccinated.  Pneumococcal polysaccharide (PPSV23) vaccine. You may need one or two doses if you smoke cigarettes or if you have certain conditions.  Meningococcal vaccine. One dose is recommended if you are age 24-21 years and a first-year college student living in a residence hall, or if you have one of several medical conditions. You may also need additional booster doses.  Hepatitis A vaccine. You may need this if you have certain conditions or if you travel or work in places where you may be exposed to hepatitis A.  Hepatitis B vaccine. You may need this if  you have certain conditions or if you travel or work in places where you may be exposed to hepatitis B.  Haemophilus influenzae type b (Hib) vaccine. You may need this if you have certain risk factors.  Talk to your health care provider about which screenings and vaccines you need and how  often you need them. What steps can I take to develop healthy behaviors?  Have regular preventive health care visits with your primary care physician and dentist.  Eat a healthy diet.  Drink enough fluid to keep your urine clear or pale yellow.  Stay active. Exercise at least 30 minutes 5 or more days of the week.  Use alcohol responsibly.  Maintain a healthy weight.  Do not use any products that contain nicotine, such as cigarettes, chewing tobacco, and e-cigarettes. If you need help quitting, ask your health care provider.  Do not use drugs.  Practice safe sex.  Use birth control (contraception) to prevent unwanted pregnancy. If you plan to become pregnant, see your health care provider for a pre-conception visit.  Find healthy ways to manage stress. How can I protect myself from injury? Injuries from violence or accidents are the leading cause of death among young adults and can often be prevented. Take these steps to help protect yourself:  Always wear your seat belt while driving or riding in a vehicle.  Do not drive if you have been drinking alcohol. Do not ride with someone who has been drinking.  Do not drive when you are tired or distracted. Do not text while driving.  Wear a helmet and other protective equipment during sports activities.  If you have firearms in your house, make sure you follow all gun safety procedures.  Seek help if you have been bullied, physically abused, or sexually abused.  Use the Internet responsibly to avoid dangers such as online bullying and online sexual predators.  What can I do to cope with stress? Young adults may face many new challenges that can be stressful, such as finding a job, going to college, moving away from home, managing money, being in a relationship, getting married, and having children. To manage stress:  Avoid known stressful situations when you can.  Exercise regularly.  Find a stress-reducing activity that  works best for you. Examples include meditation, yoga, listening to music, or reading.  Spend time in nature.  Keep a journal to write about your stress and how you respond.  Talk to your health care provider about stress. He or she may suggest counseling.  Spend time with supportive friends or family.  Do not cope with stress by: ? Drinking alcohol or using drugs. ? Smoking cigarettes. ? Eating.  Where can I get more information? Learn more about preventive care and healthy habits from:  [place] and Gynecologists: KaraokeExchange.nl  U.S. Probation officer Task Force: StageSync.si  National Adolescent and [place]: StrategicRoad.nl  American Academy of Pediatrics Bright Futures: https://brightfutures.MemberVerification.co.za  Society for Adolescent Health and Medicine: MoralBlog.co.za.aspx  PodExchange.nl: ToyLending.fr  This information is not intended to replace advice given to you by your health care provider. Make sure you discuss any questions you have with your health care provider. Document Released: 02/06/2016 Document Revised: 02/27/2016 Document Reviewed: 02/06/2016 Elsevier Interactive Patient Education  [employer].

## 2018-02-11 NOTE — Assessment & Plan Note (Addendum)
Intermittent in nature likely muscular in nature.  Not currently in flare.  Previously well controlled with naproxen 500 mg and Robaxin as needed.  I have given the patient a written prescription for naproxen to use as needed.  She will contact me if symptoms are uncontrolled.

## 2018-02-11 NOTE — Assessment & Plan Note (Signed)
Well-controlled on Zoloft 50 mg and hydroxyzine as needed.  Her gad 7 score was 0.  Her PHQ 9 score was 0.  She is adjusting to her new school well.  I have sent in a full years worth of refills.  She will contact me if she has any needs.  Plan to see her around December.

## 2018-02-15 LAB — PAP LB, CT-NG TV RFX HPV ASCU
Chlamydia, Nuc. Acid Amp: NEGATIVE
Gonococcus, Nuc. Acid Amp: NEGATIVE
PAP Smear Comment: 0
TRICH VAG BY NAA: NEGATIVE

## 2018-03-21 ENCOUNTER — Encounter: Payer: Self-pay | Admitting: Family Medicine

## 2018-03-21 ENCOUNTER — Ambulatory Visit (INDEPENDENT_AMBULATORY_CARE_PROVIDER_SITE_OTHER): Payer: BLUE CROSS/BLUE SHIELD | Admitting: Family Medicine

## 2018-03-21 VITALS — BP 114/74 | HR 82 | Temp 98.6°F | Ht 63.0 in | Wt 112.0 lb

## 2018-03-21 DIAGNOSIS — R3915 Urgency of urination: Secondary | ICD-10-CM | POA: Diagnosis not present

## 2018-03-21 DIAGNOSIS — R102 Pelvic and perineal pain: Secondary | ICD-10-CM | POA: Diagnosis not present

## 2018-03-21 LAB — URINALYSIS
BILIRUBIN UA: NEGATIVE
Glucose, UA: NEGATIVE
Ketones, UA: NEGATIVE
Leukocytes, UA: NEGATIVE
NITRITE UA: NEGATIVE
PH UA: 6.5 (ref 5.0–7.5)
PROTEIN UA: NEGATIVE
SPEC GRAV UA: 1.025 (ref 1.005–1.030)
UUROB: 0.2 mg/dL (ref 0.2–1.0)

## 2018-03-21 LAB — WET PREP FOR TRICH, YEAST, CLUE
Clue Cell Exam: NEGATIVE
Trichomonas Exam: NEGATIVE
Yeast Exam: NEGATIVE

## 2018-03-21 MED ORDER — BENZOCAINE (TOPICAL) 20 % EX AERO: INHALATION_SPRAY | CUTANEOUS | 0 refills | Status: DC

## 2018-03-21 MED ORDER — FLUTICASONE PROPIONATE 50 MCG/ACT NA SUSP: 2.0000 | Freq: Every day | NASAL | 6 refills | Status: DC

## 2018-03-21 NOTE — Patient Instructions (Addendum)
I will call you with results of your labs once they return.  For now, consider returning back to the other product we discussed.  I have sent in the hurricane spray like we discussed.  You may use this if needed for pain.   Healthy vaginal hygiene practices   -  Avoid sleeper pajamas. Nightgowns allow air to circulate.  Sleep without underpants whenever possible.  -  Wear cotton underpants during the day. Double-rinse underwear after washing to avoid residual irritants. Do not use fabric softeners for underwear and swimsuits.  - Avoid tights, leotards, leggings, "skinny" jeans, and other tight-fitting clothing. Skirts and loose-fitting pants allow air to circulate.  - Avoid pantyliners.  Instead use tampons or cotton pads.  - Daily warm bathing is helpful:     - Soak in clean water (no soap) for 10 to 15 minutes. Adding vinegar or baking soda to the water has not been specifically studied and may not be better than clean water alone.      - Use soap to wash regions other than the genital area just before getting out of the tub. Limit use of any soap on genital areas. Use fragance-free soaps.     - Rinse the genital area well and gently pat dry.  Don't rub.  Hair dryer to assist with drying can be used only if on cool setting.     - Do not use bubble baths or perfumed soaps.  - Do not use any feminine sprays, douches or powders.  These contain chemicals that will irritate the skin.  - If the genital area is tender or swollen, cool compresses may relieve the discomfort. Unscented wet wipes can be used instead of toilet paper for wiping.   - Emollients, such as Vaseline, may help protect skin and can be applied to the irritated area.  - Always remember to wipe front-to-back after bowel movements. Pat dry after urination.  - Do not sit in wet swimsuits for long periods of time after swimming

## 2018-03-21 NOTE — Progress Notes (Signed)
Subjective: CC: vaginal pain PCP: Raliegh Ip, DO ZOX:WRUEAV [name] is a 22 y.o. female presenting to clinic today for:  1. Vaginal Pain Patient reports that vaginal pain started around 2:00 this morning.  She notes that the pain was throbbing and severe.  She took ibuprofen and use an ice pack but this did not help much.  She was able to get back to sleep but notes that she continued to have some vaginal pain this morning.  Pain seems to have gotten better now that she is moving around.  She denies any abnormal vaginal discharge, vaginal odors.  She reports dyspareunia that is been ongoing for about 1 month now.  She denies vaginal pruritis, abnormal vaginal bleeding, dysuria, hematuria, nausea, vomiting, fevers, post coital bleeding, sore throat, rashes.  She does report some urinary urgency.  Negative history of STDs.  She is sexually active and does use condoms with each intercourse.  She notes that she did change lubricants from K-Y jelly to Astra glide recently.  She also gets genital laser hair removal regularly.  Last unprotected intercourse n/a.  Contraception: OCPs.  Patient's last menstrual period was 03/07/2018.  ROS: Per HPI  Allergies  Allergen Reactions  . Penicillin G Rash   Past Medical History:  Diagnosis Date  . Allergy   . Anxiety    OCD-seeing counselor in [facility]    Current Outpatient Medications:  .  fexofenadine (ALLEGRA) 180 MG tablet, One daily to twice daily For allergy symptoms, Disp: 60 tablet, Rfl: 11 .  hydrOXYzine (ATARAX/VISTARIL) 50 MG tablet, Take 0.5-1 tablets (25-50 mg total) by mouth 3 (three) times daily as needed for anxiety., Disp: 30 tablet, Rfl: 0 .  Multiple Vitamin (MULTIVITAMIN WITH MINERALS) TABS tablet, Take 1 tablet by mouth daily., Disp: , Rfl:  .  naproxen (NAPROSYN) 500 MG tablet, Take 1 tablet (500 mg total) by mouth 2 (two) times daily with a meal., Disp: 30 tablet, Rfl: 0 .  Norgestimate-Ethinyl Estradiol  Triphasic (TRI-PREVIFEM) 0.18/0.215/0.25 MG-35 MCG tablet, Take 1 tablet by mouth daily., Disp: 3 Package, Rfl: 4 .  sertraline (ZOLOFT) 50 MG tablet, Take 1 tablet (50 mg total) by mouth daily., Disp: 90 tablet, Rfl: 4 Social History   Socioeconomic History  . Marital status: Single    Spouse name: Not on file  . Number of children: Not on file  . Years of education: Not on file  . Highest education level: Not on file  Occupational History  . Not on file  Social Needs  . Financial resource strain: Not on file  . Food insecurity:    Worry: Not on file    Inability: Not on file  . Transportation needs:    Medical: Not on file    Non-medical: Not on file  Tobacco Use  . Smoking status: Never Smoker  . Smokeless tobacco: Never Used  Substance and Sexual Activity  . Alcohol use: No  . Drug use: No  . Sexual activity: Yes    Partners: Male    Birth control/protection: Pill, Condom  Lifestyle  . Physical activity:    Days per week: Not on file    Minutes per session: Not on file  . Stress: Not on file  Relationships  . Social connections:    Talks on phone: Not on file    Gets together: Not on file    Attends religious service: Not on file    Active member of club or organization: Not on file  Attends meetings of clubs or organizations: Not on file    Relationship status: Not on file  . Intimate partner violence:    Fear of current or ex partner: Not on file    Emotionally abused: Not on file    Physically abused: Not on file    Forced sexual activity: Not on file  Other Topics Concern  . Not on file  Social History Narrative  . Not on file   Family History  Problem Relation Age of Onset  . Depression Mother   . Hyperlipidemia Mother   . Hypertension Mother   . Depression Sister   . Heart disease Maternal Grandfather   . Heart attack Maternal Grandfather 59  . Diabetes Paternal Grandfather   . Heart disease Paternal Grandfather   . Hypertension Paternal  Grandfather     Objective: Office vital signs reviewed. BP 114/74   Pulse 82   Temp 98.6 F (37 C) (Oral)   Ht 5\' 3"  (1.6 m)   Wt 112 lb (50.8 kg)   BMI 19.84 kg/m   Physical Examination:  General: Awake, alert, well nourished, No acute distress HEENT: sclera white, MMM Pulm: normal work of breathing on room air GI: soft, non-tender, non-distended, bowel sounds present x4, no hepatomegaly, no splenomegaly, no masses GU: external vaginal tissue mildly erythematous.  No external genital lesions appreciated.  No skin breakdown, cervix midline and normal in appearance, no punctate lesions on cervix appreciated, mild white discharge from cervical os, no bleeding, no cervical motion tenderness, no abdominal/ adnexal masses.  No vaginal odors appreciated.  Assessment/ Plan: 22 y.o. female   1. Vaginal pain Possibly related to chemical irritation from new lubricant.  Other differential diagnoses discussed include STD versus psychosomatic manifestations of anxiety and depression versus pelvic floor dysfunction.  We discussed checking for pelvic infection today.  If this is negative and symptoms are persistent despite discontinuation of new lubricant, could consider referral to pelvic floor rehab.  Okay to continue ice packs, NSAID.  I have added Hurricaine spray to use if needed for discomfort.  Wet prep was negative for trichomonas, bacterial vaginosis and yeast.  GC chlamydia has been sent.  We will also check urinalysis to rule out infection given urinary urgency. - GC/Chlamydia Probe Amp([facility]) - WET PREP FOR TRICH, YEAST, CLUE  2. Urinary urgency - Urinalysis   Orders Placed This Encounter  Procedures  . GC/Chlamydia Probe Amp([facility])  . WET PREP FOR TRICH, YEAST, CLUE  . Urinalysis   Meds ordered this encounter  Medications  . benzocaine (HURRICAINE) 20 % oral spray    Sig: Use as directed for pain relief.    Dispense:  57 mL    Refill:  0  . fluticasone (FLONASE) 50  MCG/ACT nasal spray    Sig: Place 2 sprays into both nostrils daily.    Dispense:  16 g    Refill:  6     Chelsea Fleming [name] [place], DO [facility] [phone]

## 2018-03-22 LAB — GC/CHLAMYDIA PROBE AMP
Chlamydia trachomatis, NAA: NEGATIVE
Neisseria gonorrhoeae by PCR: NEGATIVE

## 2018-05-10 ENCOUNTER — Other Ambulatory Visit: Payer: Self-pay | Admitting: Family Medicine

## 2018-07-10 DIAGNOSIS — J00 Acute nasopharyngitis [common cold]: Secondary | ICD-10-CM | POA: Diagnosis not present

## 2018-07-10 DIAGNOSIS — Z793 Long term (current) use of hormonal contraceptives: Secondary | ICD-10-CM | POA: Diagnosis not present

## 2018-07-10 DIAGNOSIS — Z880 Allergy status to penicillin: Secondary | ICD-10-CM | POA: Diagnosis not present

## 2018-07-10 DIAGNOSIS — J029 Acute pharyngitis, unspecified: Secondary | ICD-10-CM | POA: Diagnosis not present

## 2018-07-10 DIAGNOSIS — R000 Tachycardia, unspecified: Secondary | ICD-10-CM | POA: Diagnosis not present

## 2018-07-10 DIAGNOSIS — Z79899 Other long term (current) drug therapy: Secondary | ICD-10-CM | POA: Diagnosis not present

## 2018-07-10 DIAGNOSIS — R509 Fever, unspecified: Secondary | ICD-10-CM | POA: Diagnosis not present

## 2018-08-08 ENCOUNTER — Other Ambulatory Visit: Payer: Self-pay | Admitting: Family Medicine

## 2018-08-28 ENCOUNTER — Other Ambulatory Visit: Payer: Self-pay | Admitting: Family Medicine

## 2018-09-09 ENCOUNTER — Other Ambulatory Visit: Payer: Self-pay | Admitting: Family Medicine

## 2018-09-13 DIAGNOSIS — N3001 Acute cystitis with hematuria: Secondary | ICD-10-CM | POA: Diagnosis not present

## 2018-09-21 ENCOUNTER — Ambulatory Visit (INDEPENDENT_AMBULATORY_CARE_PROVIDER_SITE_OTHER): Payer: BLUE CROSS/BLUE SHIELD | Admitting: Family Medicine

## 2018-09-21 ENCOUNTER — Encounter: Payer: Self-pay | Admitting: Family Medicine

## 2018-09-21 VITALS — BP 117/76 | HR 92 | Temp 98.6°F | Ht 63.0 in | Wt 122.0 lb

## 2018-09-21 DIAGNOSIS — Z83438 Family history of other disorder of lipoprotein metabolism and other lipidemia: Secondary | ICD-10-CM

## 2018-09-21 DIAGNOSIS — Z0000 Encounter for general adult medical examination without abnormal findings: Secondary | ICD-10-CM

## 2018-09-21 DIAGNOSIS — R3121 Asymptomatic microscopic hematuria: Secondary | ICD-10-CM | POA: Diagnosis not present

## 2018-09-21 DIAGNOSIS — Z0001 Encounter for general adult medical examination with abnormal findings: Secondary | ICD-10-CM

## 2018-09-21 DIAGNOSIS — Z7251 High risk heterosexual behavior: Secondary | ICD-10-CM | POA: Diagnosis not present

## 2018-09-21 DIAGNOSIS — R002 Palpitations: Secondary | ICD-10-CM | POA: Diagnosis not present

## 2018-09-21 DIAGNOSIS — Z3009 Encounter for other general counseling and advice on contraception: Secondary | ICD-10-CM

## 2018-09-21 LAB — MICROSCOPIC EXAMINATION
Epithelial Cells (non renal): NONE SEEN /hpf (ref 0–10)
Renal Epithel, UA: NONE SEEN /hpf
WBC, UA: NONE SEEN /hpf (ref 0–5)

## 2018-09-21 LAB — URINALYSIS, COMPLETE
Bilirubin, UA: NEGATIVE
Glucose, UA: NEGATIVE
Ketones, UA: NEGATIVE
Leukocytes, UA: NEGATIVE
NITRITE UA: NEGATIVE
PH UA: 7 (ref 5.0–7.5)
PROTEIN UA: NEGATIVE
Specific Gravity, UA: 1.01 (ref 1.005–1.030)
UUROB: 0.2 mg/dL (ref 0.2–1.0)

## 2018-09-21 NOTE — Progress Notes (Signed)
Chelsea Fleming is a 22 y.o. female presents to office today for annual physical exam examination.    Concerns today include: 1. Tachycardia Patient is that she is seen in the emergency department for tachycardia in October.  She was determined to be dehydrated.  She was given fluids and symptoms improved.  She is overall felt well since that time and denies any heart palpitations or chest pain now.  2.  Urinary tract infection Patient was seen for dysuria that have been ongoing for 1 month last week at the urgent care.  She was prescribed Macrobid.  She was never called with urine culture.  She is not visualized any blood in her urine, had any fevers or had any persistent dysuria.  Denies any abnormal vaginal discharge or itching.  3.  URI Patient reports that last week she was also treated for tonsillar swelling with prednisone.  The swelling is since gone down but she has been having some sinus congestion and rhinorrhea.  She does report cough and is not taking any cough suppressant because she is trying to get mucus up.  No shortness of breath or wheeze.  4.  Contraception Patient reports compliance with oral contraceptive pill.  She is interested in having a Nexplanon implanted instead of using the pill daily.  She does report sexual activity with more than 1 partner.  She does not always use condom use.  Again, denies any vaginal discharge, pelvic pain or abnormal vaginal bleeding.  Periods are regular and occur monthly.  They last about 3 to 5 days are not overly heavy or cramping.  She has used Plan B a couple of times.  Occupation: Ship broker.  She plans to graduate next semester, Marital status: Not married Diet: good, Exercise: regular Last pap smear: 02/2018 normal. Refills needed today: none Immunizations needed:UTD  Past Medical History:  Diagnosis Date  . Allergy   . Anxiety    OCD-seeing counselor in [employer]   Social History   Socioeconomic History  . Marital  status: Single    Spouse name: Not on file  . Number of children: Not on file  . Years of education: Not on file  . Highest education level: Not on file  Occupational History  . Not on file  Social Needs  . Financial resource strain: Not on file  . Food insecurity:    Worry: Not on file    Inability: Not on file  . Transportation needs:    Medical: Not on file    Non-medical: Not on file  Tobacco Use  . Smoking status: Never Smoker  . Smokeless tobacco: Never Used  Substance and Sexual Activity  . Alcohol use: No  . Drug use: No  . Sexual activity: Yes    Partners: Male    Birth control/protection: Pill, Condom  Lifestyle  . Physical activity:    Days per week: Not on file    Minutes per session: Not on file  . Stress: Not on file  Relationships  . Social connections:    Talks on phone: Not on file    Gets together: Not on file    Attends religious service: Not on file    Active member of club or organization: Not on file    Attends meetings of clubs or organizations: Not on file    Relationship status: Not on file  . Intimate partner violence:    Fear of current or ex partner: Not on file    Emotionally abused:  Not on file    Physically abused: Not on file    Forced sexual activity: Not on file  Other Topics Concern  . Not on file  Social History Narrative  . Not on file   No past surgical history on file. Family History  Problem Relation Age of Onset  . Depression Mother   . Hyperlipidemia Mother   . Hypertension Mother   . Depression Sister   . Heart disease Maternal Grandfather   . Heart attack Maternal Grandfather 59  . Diabetes Paternal Grandfather   . Heart disease Paternal Grandfather   . Hypertension Paternal Grandfather     Current Outpatient Medications:  .  benzocaine (HURRICAINE) 20 % oral spray, Use as directed for pain relief., Disp: 57 mL, Rfl: 0 .  fexofenadine (ALLEGRA) 180 MG tablet, One daily to twice daily For allergy symptoms, Disp:  60 tablet, Rfl: 11 .  fluticasone (FLONASE) 50 MCG/ACT nasal spray, SPRAY 2 SPRAYS INTO EACH NOSTRIL EVERY DAY, Disp: 16 g, Rfl: 0 .  hydrOXYzine (ATARAX/VISTARIL) 50 MG tablet, Take 0.5-1 tablets (25-50 mg total) by mouth 3 (three) times daily as needed for anxiety., Disp: 30 tablet, Rfl: 0 .  Multiple Vitamin (MULTIVITAMIN WITH MINERALS) TABS tablet, Take 1 tablet by mouth daily., Disp: , Rfl:  .  naproxen (NAPROSYN) 500 MG tablet, Take 1 tablet (500 mg total) by mouth 2 (two) times daily with a meal., Disp: 30 tablet, Rfl: 0 .  sertraline (ZOLOFT) 50 MG tablet, TAKE 1 TABLET BY MOUTH EVERY DAY, Disp: 90 tablet, Rfl: 0 .  TRI-PREVIFEM 0.18/0.215/0.25 MG-35 MCG tablet, TAKE 1 TABLET BY MOUTH EVERY DAY, Disp: 84 tablet, Rfl: 4  Allergies  Allergen Reactions  . Penicillin G Rash     ROS: Review of Systems Constitutional: negative Eyes: negative Ears, nose, mouth, throat, and face: nasal congestion, rhinorrhea and cough Respiratory: negative Cardiovascular: negative Gastrointestinal: negative Genitourinary:negative Integument/breast: negative Hematologic/lymphatic: negative Musculoskeletal:negative Neurological: negative Behavioral/Psych: negative Endocrine: negative Allergic/Immunologic: negative    Physical exam BP 117/76   Pulse 92   Temp 98.6 F (37 C) (Oral)   Ht '5\' 3"'  (1.6 m)   Wt 122 lb (55.3 kg)   BMI 21.61 kg/m  General appearance: alert, cooperative, appears stated age and no distress Head: Normocephalic, without obvious abnormality, atraumatic Eyes: negative findings: lids and lashes normal, conjunctivae and sclerae normal, corneas clear and pupils equal, round, reactive to light and accomodation Ears: normal TM's and external ear canals both ears Nose: Nares normal. Septum midline. Mucosa normal. No drainage or sinus tenderness. Throat: lips, mucosa, and tongue normal; teeth and gums normal Neck: supple, symmetrical, trachea midline, thyroid not enlarged,  symmetric, no tenderness/mass/nodules and mildly enlarged anterior cervical lymph nodes Back: symmetric, no curvature. ROM normal. No CVA tenderness. Lungs: clear to auscultation bilaterally Heart: regular rate and rhythm, S1, S2 normal, no murmur, click, rub or gallop Abdomen: soft, non-tender; bowel sounds normal; no masses,  no organomegaly Extremities: extremities normal, atraumatic, no cyanosis or edema Pulses: 2+ and symmetric Skin: Skin color, texture, turgor normal. No rashes or lesions Lymph nodes: mildly enlarged anterior cervical lymph nodes. Neurologic: Alert and oriented X 3, normal strength and tone. Normal symmetric reflexes. Normal coordination and gait  Psych: Mood stable, speech normal, affect appropriate, pleasant interactive. Depression screen [facility] 2/9 09/21/2018 02/11/2018 09/15/2017  Decreased Interest 0 0 0  Down, Depressed, Hopeless 0 0 0  PHQ - 2 Score 0 0 0  Altered sleeping 1 - -  Tired, decreased  energy 1 - -  Change in appetite 0 - -  Feeling bad or failure about yourself  0 - -  Trouble concentrating 1 - -  Moving slowly or fidgety/restless 0 - -  Suicidal thoughts 0 - -  PHQ-9 Score 3 - -   Assessment/ Plan: Chinita Pester here for annual physical exam.   1. Annual physical exam Overall healthy exam.  She is up-to-date on Pap smear and vaccinations.  We discussed possible insertion of Nexplanon.  She will schedule this appointment during her next break from school.  Fasting labs have been placed.  Patient will come in at her convenience to have these obtained. - Lipid Panel; Future  2. Asymptomatic microscopic hematuria Recent UTI treatment.  I have sent this for urine culture.  However, we discussed that if this is persistent microscopic hematuria in the absence of infection we will plan for referral to urology.  She was good understanding. - Urinalysis, Complete - Urine Culture - CBC; Future  3. Palpitation Seems to have resolved.  Given low weight  in the past, we will also check TSH. - TSH; Future - CMP14+EGFR; Future - CBC; Future  4. Family history of hyperlipidemia - Lipid Panel; Future  5. Screening for STI Safe sex practices reinforced.  Continue OCP. - HIV Antibody (routine testing w rflx); Future - RPR; Future  6. Encounter for counseling regarding contraception Considering Nexplanon placement.  Patient to follow up in 1 year for annual exam or sooner if needed.  Laquan Ludden M. Lajuana Ripple, DO

## 2018-09-21 NOTE — Patient Instructions (Signed)
I can put in a Nexplanon if this interests you.  Try and schedule an appointment when you are on the placebo week of your birth control.  Make sure you let [place] know about 1 week ahead for the appointment so we can order the device.  Preventive Care 18-39 Years, Female Preventive care refers to lifestyle choices and visits with your health care provider that can promote health and wellness. What does preventive care include?   A yearly physical exam. This is also called an annual well check.  Dental exams once or twice a year.  Routine eye exams. Ask your health care provider how often you should have your eyes checked.  Personal lifestyle choices, including: ? Daily care of your teeth and gums. ? Regular physical activity. ? Eating a healthy diet. ? Avoiding tobacco and drug use. ? Limiting alcohol use. ? Practicing safe sex. ? Taking vitamin and mineral supplements as recommended by your health care provider. What happens during an annual well check? The services and screenings done by your health care provider during your annual well check will depend on your age, overall health, lifestyle risk factors, and family history of disease. Counseling Your health care provider may ask you questions about your:  Alcohol use.  Tobacco use.  Drug use.  Emotional well-being.  Home and relationship well-being.  Sexual activity.  Eating habits.  Work and work Statistician.  Method of birth control.  Menstrual cycle.  Pregnancy history. Screening You may have the following tests or measurements:  Height, weight, and BMI.  Diabetes screening. This is done by checking your blood sugar (glucose) after you have not eaten for a while (fasting).  Blood pressure.  Lipid and cholesterol levels. These may be checked every 5 years starting at age 43.  Skin check.  Hepatitis C blood test.  Hepatitis B blood test.  Sexually transmitted disease (STD) testing.  BRCA-related  cancer screening. This may be done if you have a family history of breast, ovarian, tubal, or peritoneal cancers.  Pelvic exam and Pap test. This may be done every 3 years starting at age 79. Starting at age 4, this may be done every 5 years if you have a Pap test in combination with an HPV test. Discuss your test results, treatment options, and if necessary, the need for more tests with your health care provider. Vaccines Your health care provider may recommend certain vaccines, such as:  Influenza vaccine. This is recommended every year.  Tetanus, diphtheria, and acellular pertussis (Tdap, Td) vaccine. You may need a Td booster every 10 years.  Varicella vaccine. You may need this if you have not been vaccinated.  HPV vaccine. If you are 77 or younger, you may need three doses over 6 months.  Measles, mumps, and rubella (MMR) vaccine. You may need at least one dose of MMR. You may also need a second dose.  Pneumococcal 13-valent conjugate (PCV13) vaccine. You may need this if you have certain conditions and were not previously vaccinated.  Pneumococcal polysaccharide (PPSV23) vaccine. You may need one or two doses if you smoke cigarettes or if you have certain conditions.  Meningococcal vaccine. One dose is recommended if you are age 35-21 years and a first-year college student living in a residence hall, or if you have one of several medical conditions. You may also need additional booster doses.  Hepatitis A vaccine. You may need this if you have certain conditions or if you travel or work in places where you  may be exposed to hepatitis A.  Hepatitis B vaccine. You may need this if you have certain conditions or if you travel or work in places where you may be exposed to hepatitis B.  Haemophilus influenzae type b (Hib) vaccine. You may need this if you have certain risk factors. Talk to your health care provider about which screenings and vaccines you need and how often you need  them. This information is not intended to replace advice given to you by your health care provider. Make sure you discuss any questions you have with your health care provider. Document Released: 11/17/2001 Document Revised: 05/04/2017 Document Reviewed: 07/23/2015 Elsevier Interactive Patient Education  2019 [place] is this medicine? ETONOGESTREL (et oh noe JES trel) is a contraceptive (birth control) device. It is used to prevent pregnancy. It can be used for up to 3 years. This medicine may be used for other purposes; ask your health care provider or pharmacist if you have questions. COMMON BRAND NAME(S): Implanon, Nexplanon What should I tell my health care provider before I take this medicine? They need to know if you have any of these conditions: -abnormal vaginal bleeding -blood vessel disease or blood clots -breast, cervical, endometrial, ovarian, liver, or uterine cancer -diabetes -gallbladder disease -heart disease or recent heart attack -high blood pressure -high cholesterol or triglycerides -kidney disease -liver disease -migraine headaches -seizures -stroke -tobacco smoker -an unusual or allergic reaction to etonogestrel, anesthetics or antiseptics, other medicines, foods, dyes, or preservatives -pregnant or trying to get pregnant -breast-feeding How should I use this medicine? This device is inserted just under the skin on the inner side of your upper arm by a health care professional. Talk to your pediatrician regarding the use of this medicine in children. Special care may be needed. Overdosage: If you think you have taken too much of this medicine contact a poison control center or emergency room at once. NOTE: This medicine is only for you. Do not share this medicine with others. What if I miss a dose? This does not apply. What may interact with this medicine? Do not take this medicine with any of the following  medications: -amprenavir -fosamprenavir This medicine may also interact with the following medications: -acitretin -aprepitant -armodafinil -bexarotene -bosentan -carbamazepine -certain medicines for fungal infections like fluconazole, ketoconazole, itraconazole and voriconazole -certain medicines to treat hepatitis, HIV or AIDS -cyclosporine -felbamate -griseofulvin -lamotrigine -modafinil -oxcarbazepine -phenobarbital -phenytoin -primidone -rifabutin -rifampin -rifapentine -St. John's wort -topiramate This list may not describe all possible interactions. Give your health care provider a list of all the medicines, herbs, non-prescription drugs, or dietary supplements you use. Also tell them if you smoke, drink alcohol, or use illegal drugs. Some items may interact with your medicine. What should I watch for while using this medicine? This product does not protect you against HIV infection (AIDS) or other sexually transmitted diseases. You should be able to feel the implant by pressing your fingertips over the skin where it was inserted. Contact your doctor if you cannot feel the implant, and use a non-hormonal birth control method (such as condoms) until your doctor confirms that the implant is in place. Contact your doctor if you think that the implant may have broken or become bent while in your arm. You will receive a user card from your health care provider after the implant is inserted. The card is a record of the location of the implant in your upper arm and when it should be removed.  Keep this card with your health records. What side effects may I notice from receiving this medicine? Side effects that you should report to your doctor or health care professional as soon as possible: -allergic reactions like skin rash, itching or hives, swelling of the face, lips, or tongue -breast lumps, breast tissue changes, or discharge -breathing problems -changes in emotions or  moods -if you feel that the implant may have broken or bent while in your arm -high blood pressure -pain, irritation, swelling, or bruising at the insertion site -scar at site of insertion -signs of infection at the insertion site such as fever, and skin redness, pain or discharge -signs and symptoms of a blood clot such as breathing problems; changes in vision; chest pain; severe, sudden headache; pain, swelling, warmth in the leg; trouble speaking; sudden numbness or weakness of the face, arm or leg -signs and symptoms of liver injury like dark yellow or brown urine; general ill feeling or flu-like symptoms; light-colored stools; loss of appetite; nausea; right upper belly pain; unusually weak or tired; yellowing of the eyes or skin -unusual vaginal bleeding, discharge Side effects that usually do not require medical attention (report to your doctor or health care professional if they continue or are bothersome): -acne -breast pain or tenderness -headache -irregular menstrual bleeding -nausea This list may not describe all possible side effects. Call your doctor for medical advice about side effects. You may report side effects to FDA at 1-800-FDA-1088. Where should I keep my medicine? This drug is given in a hospital or clinic and will not be stored at home. NOTE: This sheet is a summary. It may not cover all possible information. If you have questions about this medicine, talk to your doctor, pharmacist, or health care provider.  2019 Elsevier/Gold Standard (2017-08-10 14:11:42)

## 2018-09-23 ENCOUNTER — Other Ambulatory Visit: Payer: Self-pay | Admitting: Family Medicine

## 2018-09-23 DIAGNOSIS — R3129 Other microscopic hematuria: Secondary | ICD-10-CM

## 2018-09-23 LAB — URINE CULTURE

## 2018-09-24 ENCOUNTER — Encounter: Payer: Self-pay | Admitting: Family Medicine

## 2018-09-24 ENCOUNTER — Other Ambulatory Visit: Payer: BLUE CROSS/BLUE SHIELD

## 2018-09-24 DIAGNOSIS — Z7251 High risk heterosexual behavior: Secondary | ICD-10-CM

## 2018-09-24 DIAGNOSIS — R3121 Asymptomatic microscopic hematuria: Secondary | ICD-10-CM

## 2018-09-24 DIAGNOSIS — Z0000 Encounter for general adult medical examination without abnormal findings: Secondary | ICD-10-CM

## 2018-09-24 DIAGNOSIS — Z83438 Family history of other disorder of lipoprotein metabolism and other lipidemia: Secondary | ICD-10-CM | POA: Diagnosis not present

## 2018-09-24 DIAGNOSIS — R002 Palpitations: Secondary | ICD-10-CM | POA: Diagnosis not present

## 2018-09-25 LAB — CMP14+EGFR
ALT: 57 IU/L — ABNORMAL HIGH (ref 0–32)
AST: 47 IU/L — ABNORMAL HIGH (ref 0–40)
Albumin/Globulin Ratio: 1.3 (ref 1.2–2.2)
Albumin: 4 g/dL (ref 3.5–5.5)
Alkaline Phosphatase: 63 IU/L (ref 39–117)
BUN/Creatinine Ratio: 15 (ref 9–23)
BUN: 11 mg/dL (ref 6–20)
Bilirubin Total: 0.3 mg/dL (ref 0.0–1.2)
CHLORIDE: 103 mmol/L (ref 96–106)
CO2: 22 mmol/L (ref 20–29)
Calcium: 9.4 mg/dL (ref 8.7–10.2)
Creatinine, Ser: 0.74 mg/dL (ref 0.57–1.00)
GFR calc Af Amer: 134 mL/min/{1.73_m2} (ref >59)
GFR calc non Af Amer: 116 mL/min/{1.73_m2} (ref >59)
GLUCOSE: 76 mg/dL (ref 65–99)
Globulin, Total: 3.1 g/dL (ref 1.5–4.5)
Potassium: 4.1 mmol/L (ref 3.5–5.2)
Sodium: 140 mmol/L (ref 134–144)
Total Protein: 7.1 g/dL (ref 6.0–8.5)

## 2018-09-25 LAB — LIPID PANEL
CHOLESTEROL TOTAL: 154 mg/dL (ref 100–199)
Chol/HDL Ratio: 1.9 ratio (ref 0.0–4.4)
HDL: 83 mg/dL (ref >39)
LDL Calculated: 57 mg/dL (ref 0–99)
Triglycerides: 68 mg/dL (ref 0–149)
VLDL Cholesterol Cal: 14 mg/dL (ref 5–40)

## 2018-09-25 LAB — HIV ANTIBODY (ROUTINE TESTING W REFLEX): HIV Screen 4th Generation wRfx: NONREACTIVE

## 2018-09-25 LAB — CBC
Hematocrit: 43.7 % (ref 34.0–46.6)
Hemoglobin: 14.8 g/dL (ref 11.1–15.9)
MCH: 29.8 pg (ref 26.6–33.0)
MCHC: 33.9 g/dL (ref 31.5–35.7)
MCV: 88 fL (ref 79–97)
PLATELETS: 318 10*3/uL (ref 150–450)
RBC: 4.96 x10E6/uL (ref 3.77–5.28)
RDW: 11.7 % — ABNORMAL LOW (ref 12.3–15.4)
WBC: 6 10*3/uL (ref 3.4–10.8)

## 2018-09-25 LAB — RPR: RPR Ser Ql: NONREACTIVE

## 2018-09-25 LAB — TSH: TSH: 1.27 u[IU]/mL (ref 0.450–4.500)

## 2018-10-03 ENCOUNTER — Encounter: Payer: Self-pay | Admitting: Family Medicine

## 2018-10-03 ENCOUNTER — Ambulatory Visit (INDEPENDENT_AMBULATORY_CARE_PROVIDER_SITE_OTHER): Payer: BLUE CROSS/BLUE SHIELD | Admitting: Family Medicine

## 2018-10-03 ENCOUNTER — Ambulatory Visit (INDEPENDENT_AMBULATORY_CARE_PROVIDER_SITE_OTHER): Payer: BLUE CROSS/BLUE SHIELD

## 2018-10-03 DIAGNOSIS — M542 Cervicalgia: Secondary | ICD-10-CM

## 2018-10-03 DIAGNOSIS — M62830 Muscle spasm of back: Secondary | ICD-10-CM

## 2018-10-03 DIAGNOSIS — M79631 Pain in right forearm: Secondary | ICD-10-CM | POA: Diagnosis not present

## 2018-10-03 DIAGNOSIS — S3992XA Unspecified injury of lower back, initial encounter: Secondary | ICD-10-CM | POA: Diagnosis not present

## 2018-10-03 DIAGNOSIS — S59911A Unspecified injury of right forearm, initial encounter: Secondary | ICD-10-CM | POA: Diagnosis not present

## 2018-10-03 DIAGNOSIS — M545 Low back pain: Secondary | ICD-10-CM

## 2018-10-03 DIAGNOSIS — S199XXA Unspecified injury of neck, initial encounter: Secondary | ICD-10-CM | POA: Diagnosis not present

## 2018-10-03 MED ORDER — CYCLOBENZAPRINE HCL 10 MG PO TABS: 10.0000 mg | ORAL_TABLET | Freq: Three times a day (TID) | ORAL | 1 refills | Status: DC | PRN

## 2018-10-03 MED ORDER — DICLOFENAC SODIUM 75 MG PO TBEC: 75.0000 mg | DELAYED_RELEASE_TABLET | Freq: Two times a day (BID) | ORAL | 2 refills | Status: DC

## 2018-10-03 NOTE — Patient Instructions (Signed)
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
## 2018-10-13 DIAGNOSIS — R311 Benign essential microscopic hematuria: Secondary | ICD-10-CM | POA: Diagnosis not present

## 2018-10-13 DIAGNOSIS — R3129 Other microscopic hematuria: Secondary | ICD-10-CM | POA: Diagnosis not present

## 2018-10-13 DIAGNOSIS — N390 Urinary tract infection, site not specified: Secondary | ICD-10-CM | POA: Diagnosis not present

## 2018-10-13 DIAGNOSIS — B9620 Unspecified Escherichia coli [E. coli] as the cause of diseases classified elsewhere: Secondary | ICD-10-CM | POA: Diagnosis not present

## 2018-10-14 ENCOUNTER — Ambulatory Visit (INDEPENDENT_AMBULATORY_CARE_PROVIDER_SITE_OTHER): Payer: BLUE CROSS/BLUE SHIELD | Admitting: Family Medicine

## 2018-10-14 ENCOUNTER — Encounter: Payer: Self-pay | Admitting: Family Medicine

## 2018-10-14 VITALS — BP 102/68 | HR 75 | Temp 97.5°F | Ht 63.0 in | Wt 122.0 lb

## 2018-10-14 DIAGNOSIS — M62838 Other muscle spasm: Secondary | ICD-10-CM

## 2018-10-14 DIAGNOSIS — M62830 Muscle spasm of back: Secondary | ICD-10-CM
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
## 2018-10-14 NOTE — Patient Instructions (Addendum)
You have 2 refills on the Diclofenac.  You can have this transferred to school if you still need it after the next fill.  You have prescribed a nonsteroidal anti-inflammatory drug (NSAID) today. This will help with your pain and inflammation. Please do not take any other NSAIDs (ibuprofen/Motrin/Advil, naproxen/Aleve, meloxicam/Mobic, Voltaren/diclofenac). Please make sure to eat a meal when taking this medication.   Caution:  If you have a history of acid reflux/indigestion, I recommend that you take an antacid (such as Prilosec, Prevacid) daily while on the NSAID.  If you have a history of bleeding disorder, gastric ulcer, are on a blood thinner (like warfarin/Coumadin, Xarelto, Eliquis, etc) please do not take NSAID.  If you have ever had a heart attack, you should not take NSAIDs.

## 2018-10-25 DIAGNOSIS — M542 Cervicalgia: Secondary | ICD-10-CM | POA: Diagnosis not present

## 2018-10-25 DIAGNOSIS — M545 Low back pain: Secondary | ICD-10-CM | POA: Diagnosis not present

## 2018-11-01 DIAGNOSIS — M545 Low back pain: Secondary | ICD-10-CM | POA: Diagnosis not present

## 2018-11-01 DIAGNOSIS — M542 Cervicalgia: Secondary | ICD-10-CM | POA: Diagnosis not present

## 2018-11-03 DIAGNOSIS — M542 Cervicalgia: Secondary | ICD-10-CM | POA: Diagnosis not present

## 2018-11-03 DIAGNOSIS — M545 Low back pain: Secondary | ICD-10-CM | POA: Diagnosis not present

## 2018-11-08 DIAGNOSIS — M545 Low back pain: Secondary | ICD-10-CM | POA: Diagnosis not present

## 2018-11-08 DIAGNOSIS — M542 Cervicalgia: Secondary | ICD-10-CM | POA: Diagnosis not present

## 2018-11-10 DIAGNOSIS — M542 Cervicalgia: Secondary | ICD-10-CM | POA: Diagnosis not present

## 2018-11-10 DIAGNOSIS — M545 Low back pain: Secondary | ICD-10-CM | POA: Diagnosis not present

## 2018-11-15 DIAGNOSIS — M545 Low back pain: Secondary | ICD-10-CM | POA: Diagnosis not present

## 2018-11-15 DIAGNOSIS — M542 Cervicalgia: Secondary | ICD-10-CM | POA: Diagnosis not present

## 2018-11-17 DIAGNOSIS — M545 Low back pain: Secondary | ICD-10-CM | POA: Diagnosis not present

## 2018-11-17 DIAGNOSIS — M542 Cervicalgia: Secondary | ICD-10-CM | POA: Diagnosis not present

## 2018-11-22 DIAGNOSIS — M542 Cervicalgia: Secondary | ICD-10-CM | POA: Diagnosis not present

## 2018-11-22 DIAGNOSIS — M545 Low back pain: Secondary | ICD-10-CM | POA: Diagnosis not present

## 2018-11-24 DIAGNOSIS — M542 Cervicalgia: Secondary | ICD-10-CM | POA: Diagnosis not present

## 2018-11-24 DIAGNOSIS — M545 Low back pain: Secondary | ICD-10-CM | POA: Diagnosis not present

## 2018-11-29 DIAGNOSIS — M542 Cervicalgia: Secondary | ICD-10-CM | POA: Diagnosis not present

## 2018-11-29 DIAGNOSIS — M545 Low back pain: Secondary | ICD-10-CM | POA: Diagnosis not present

## 2018-12-01 DIAGNOSIS — M542 Cervicalgia: Secondary | ICD-10-CM | POA: Diagnosis not present

## 2018-12-01 DIAGNOSIS — M545 Low back pain: Secondary | ICD-10-CM | POA: Diagnosis not present

## 2018-12-06 DIAGNOSIS — M545 Low back pain: Secondary | ICD-10-CM | POA: Diagnosis not present

## 2018-12-06 DIAGNOSIS — M542 Cervicalgia: Secondary | ICD-10-CM | POA: Diagnosis not present

## 2018-12-11 ENCOUNTER — Other Ambulatory Visit: Payer: Self-pay | Admitting: Family Medicine

## 2018-12-28 ENCOUNTER — Other Ambulatory Visit: Payer: Self-pay | Admitting: Family Medicine

## 2018-12-28 ENCOUNTER — Encounter: Payer: Self-pay | Admitting: Family Medicine

## 2018-12-28 MED ORDER — SERTRALINE HCL 50 MG PO TABS: 50.0000 mg | ORAL_TABLET | Freq: Every day | ORAL | 0 refills | Status: DC

## 2018-12-31 ENCOUNTER — Other Ambulatory Visit: Payer: Self-pay | Admitting: Family Medicine

## 2019-02-11 ENCOUNTER — Other Ambulatory Visit: Payer: Self-pay | Admitting: Family Medicine

## 2019-02-13 ENCOUNTER — Ambulatory Visit (INDEPENDENT_AMBULATORY_CARE_PROVIDER_SITE_OTHER): Payer: BLUE CROSS/BLUE SHIELD | Admitting: Family Medicine

## 2019-02-13 ENCOUNTER — Other Ambulatory Visit: Payer: Self-pay

## 2019-02-13 DIAGNOSIS — J301 Allergic rhinitis due to pollen: Secondary | ICD-10-CM | POA: Diagnosis not present

## 2019-02-13 DIAGNOSIS — F411 Generalized anxiety disorder: Secondary | ICD-10-CM | POA: Diagnosis not present

## 2019-02-13 DIAGNOSIS — F422 Mixed obsessional thoughts and acts: Secondary | ICD-10-CM | POA: Diagnosis not present

## 2019-02-13 MED ORDER — FLUTICASONE PROPIONATE 50 MCG/ACT NA SUSP: NASAL | 5 refills | Status: DC

## 2019-02-13 MED ORDER — SERTRALINE HCL 50 MG PO TABS: 50.0000 mg | ORAL_TABLET | Freq: Every day | ORAL | 1 refills | Status: DC

## 2019-02-13 NOTE — Progress Notes (Signed)
Telephone visit  Subjective: CC: f/u GAD PCP: Raliegh Ip, DO XFG:HWEXHB Chelsea Fleming is a 23 y.o. female calls for telephone consult today. Patient provides verbal consent for consult held via phone.  Location of patient: home Location of provider: [facility] Others present for call: none  1. GAD Patient with longstanding history of generalized anxiety disorder/OCD.  She has been treated with Zoloft since 2018 and done well on the medication.  She has hydroxyzine on hand but has not taken it in quite a while because she has not needed it.  She reports excitement over graduating and transitioning into a fully funded masters program in [place] at [employer].  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  She worries somewhat about the transition to a new state and new program but ultimately wishes to taper off of the Zoloft if she can.  She does note some blunting of her mood, which she does not feels a bad thing at this time.  Overall, no anxiety symptoms except for occasional restlessness and irritability.  2.  Allergic rhinitis Patient reports good control of allergic rhinitis with Flonase.  She does need refills.  ROS: Per HPI  Allergies  Allergen Reactions  . Penicillin G Rash   Past Medical History:  Diagnosis Date  . Allergy   . Anxiety    OCD-seeing counselor in [facility]    Current Outpatient Medications:  .  cyclobenzaprine (FLEXERIL) 10 MG tablet, Take 1 tablet (10 mg total) by mouth 3 (three) times daily as needed for muscle spasms., Disp: 90 tablet, Rfl: 1 .  diclofenac (VOLTAREN) 75 MG EC tablet, TAKE 1 TABLET (75 MG TOTAL) BY MOUTH 2 (TWO) TIMES DAILY. FOR MUSCLE AND JOINT PAIN, Disp: 60 tablet, Rfl: 0 .  fexofenadine (ALLEGRA) 180 MG tablet, One daily to twice daily For allergy symptoms, Disp: 60 tablet, Rfl: 11 .  fluticasone (FLONASE) 50 MCG/ACT nasal spray, SPRAY 2 SPRAYS INTO EACH NOSTRIL EVERY DAY, Disp: 16 g, Rfl: 0 .  hydrOXYzine (ATARAX/VISTARIL) 50 MG  tablet, Take 0.5-1 tablets (25-50 mg total) by mouth 3 (three) times daily as needed for anxiety., Disp: 30 tablet, Rfl: 0 .  Multiple Vitamin (MULTIVITAMIN WITH MINERALS) TABS tablet, Take 1 tablet by mouth daily., Disp: , Rfl:  .  sertraline (ZOLOFT) 50 MG tablet, Take 1 tablet (50 mg total) by mouth daily. (Needs to be seen before next refill), Disp: 30 tablet, Rfl: 0 .  TRI-PREVIFEM 0.18/0.215/0.25 MG-35 MCG tablet, TAKE 1 TABLET BY MOUTH EVERY DAY, Disp: 84 tablet, Rfl: 4  Depression screen [facility] 2/9 02/13/2019 10/14/2018 10/03/2018  Decreased Interest 0 0 0  Down, Depressed, Hopeless 0 0 0  PHQ - 2 Score 0 0 0  Altered sleeping - 0 -  Tired, decreased energy - 0 -  Change in appetite - 0 -  Feeling bad or failure about yourself  - 0 -  Trouble concentrating - 0 -  Moving slowly or fidgety/restless - 0 -  Suicidal thoughts - 0 -  PHQ-9 Score - 0 -  Difficult doing work/chores - Not difficult at all -   GAD 7 : Generalized Anxiety Score 02/13/2019 02/11/2018 09/15/2017 08/25/2017  Nervous, Anxious, on Edge 0 0 0 3  Control/stop worrying 0 0 0 1  Worry too much - different things 0 0 0 1  Trouble relaxing 0 0 0 1  Restless 1 0 0 1  Easily annoyed or irritable 1 0 1 3  Afraid - awful might happen 0  0 0 3  Total GAD 7 Score 2 0 1 13  Anxiety Difficulty Not difficult at all Not difficult at all - Somewhat difficult    Assessment/ Plan: 23 y.o. female   1. GAD (generalized anxiety disorder) Controlled.  Contemplating taper at some point but wishes to transition into her masters program first.  6 months of Zoloft refilled.  No refills of hydroxyzine needed at this time - sertraline (ZOLOFT) 50 MG tablet; Take 1 tablet (50 mg total) by mouth daily.  Dispense: 90 tablet; Refill: 1  2. Mixed obsessional thoughts and acts As above - sertraline (ZOLOFT) 50 MG tablet; Take 1 tablet (50 mg total) by mouth daily.  Dispense: 90 tablet; Refill: 1  3. Seasonal allergic rhinitis due to  pollen Controlled with Flonase.  Needs refills. - fluticasone (FLONASE) 50 MCG/ACT nasal spray; SPRAY 2 SPRAYS INTO EACH NOSTRIL EVERY DAY  Dispense: 16 g; Refill: 5   Start time: 12:46pm End time: 12:53pm  Total time spent on patient care (including telephone call/ virtual visit): 15 minutes   [name] , DO [facility] [phone]

## 2019-02-13 NOTE — Telephone Encounter (Signed)
Gottschalk. NTBS 30 days given 12/28/18

## 2019-02-13 NOTE — Telephone Encounter (Signed)
LM 5/11-jhb

## 2019-03-30 DIAGNOSIS — Z20828 Contact with and (suspected) exposure to other viral communicable diseases: Secondary | ICD-10-CM | POA: Diagnosis not present

## 2019-05-01 ENCOUNTER — Other Ambulatory Visit: Payer: Self-pay

## 2019-05-01 DIAGNOSIS — Z20822 Contact with and (suspected) exposure to covid-19: Secondary | ICD-10-CM

## 2019-05-03 LAB — NOVEL CORONAVIRUS, NAA: SARS-CoV-2, NAA: NOT DETECTED
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
## 2019-07-30 ENCOUNTER — Other Ambulatory Visit: Payer: Self-pay | Admitting: Family Medicine

## 2019-07-30 DIAGNOSIS — F422 Mixed obsessional thoughts and acts: Secondary | ICD-10-CM

## 2019-07-30 DIAGNOSIS — F411 Generalized anxiety disorder: Secondary | ICD-10-CM

## 2019-08-15 ENCOUNTER — Other Ambulatory Visit: Payer: Self-pay | Admitting: Family Medicine

## 2019-08-15 DIAGNOSIS — J301 Allergic rhinitis due to pollen: Secondary | ICD-10-CM

## 2019-08-23 ENCOUNTER — Other Ambulatory Visit: Payer: Self-pay

## 2019-08-23 ENCOUNTER — Ambulatory Visit (INDEPENDENT_AMBULATORY_CARE_PROVIDER_SITE_OTHER): Payer: BC Managed Care – PPO | Admitting: Family Medicine

## 2019-08-23 DIAGNOSIS — F411 Generalized anxiety disorder: Secondary | ICD-10-CM

## 2019-08-23 DIAGNOSIS — J301 Allergic rhinitis due to pollen: Secondary | ICD-10-CM | POA: Diagnosis not present

## 2019-08-23 DIAGNOSIS — F422 Mixed obsessional thoughts and acts: Secondary | ICD-10-CM

## 2019-08-23 DIAGNOSIS — Z3041 Encounter for surveillance of contraceptive pills: Secondary | ICD-10-CM | POA: Diagnosis not present

## 2019-08-23 MED ORDER — SERTRALINE HCL 50 MG PO TABS: 50.0000 mg | ORAL_TABLET | Freq: Every day | ORAL | 3 refills | Status: DC

## 2019-08-23 MED ORDER — NORGESTIM-ETH ESTRAD TRIPHASIC 0.18/0.215/0.25 MG-35 MCG PO TABS: 1.0000 | ORAL_TABLET | Freq: Every day | ORAL | 3 refills | Status: DC

## 2019-08-23 MED ORDER — FLUTICASONE PROPIONATE 50 MCG/ACT NA SUSP: 2.0000 | Freq: Every day | NASAL | 2 refills | Status: DC

## 2019-08-23 NOTE — Progress Notes (Signed)
Telephone visit  Subjective: CC: Follow-up [name]/OCP PCP: Chelsea Ip, DO FEX:Chelsea Fleming is a 23 y.o. female calls for telephone consult today. Patient provides verbal consent for consult held via phone.  Location of patient: Home in [place] Location of provider: Working remotely from home Others present for call: None  1.  [name] Patient reports that she is doing extremely well in her new masters program at [employer].  She is enjoying life in [place] and is currently expecting several inches of snow.  She has had just a little bit of increased stress due to upcoming finals but overall is feeling well and notes that she is extremely happy.  Life seems to be going good.  OCD thoughts are controlled with Zoloft 50 mg daily.  She has had some difficulty establishing with a PCP.  Everything is virtual in [place] right now and she would much rather see somebody face-to-face before discussing personal issues.  Therefore, she has not yet established with a new PCP.  2.  Contraception Patient reports compliance with her OCP.  No problems.  Denies any abnormal or excessive vaginal bleeding, excessive cramping.  Menstrual cycles are regular and she just started her menstrual cycle yesterday.  3.  Allergies Patient reports good control of allergies with daily use of Flonase and Allegra.  No coughing, sneezing.   ROS: Per HPI  Allergies  Allergen Reactions  . Penicillin G Rash   Past Medical History:  Diagnosis Date  . Allergy   . 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  fexofenadine (ALLEGRA) 180 MG tablet, One daily to twice daily For allergy symptoms, Disp: 60 tablet, Rfl: 11 .  fluticasone (FLONASE) 50 MCG/ACT nasal spray, SPRAY 2 SPRAYS INTO EACH NOSTRIL EVERY DAY, Disp: 48 mL, Rfl: 0 .  hydrOXYzine (ATARAX/VISTARIL) 50 MG tablet, Take 0.5-1 tablets (25-50 mg total) by mouth 3 (three) times daily as needed for  anxiety., Disp: 30 tablet, Rfl: 0 .  Multiple Vitamin (MULTIVITAMIN WITH MINERALS) TABS tablet, Take 1 tablet by mouth daily., Disp: , Rfl:  .  sertraline (ZOLOFT) 50 MG tablet, Take 1 tablet (50 mg total) by mouth daily. (Needs to be seen before next refill), Disp: 30 tablet, Rfl: 0 .  TRI-PREVIFEM 0.18/0.215/0.25 MG-35 MCG tablet, TAKE 1 TABLET BY MOUTH EVERY DAY, Disp: 84 tablet, Rfl: 4   Gen: does not sound distressed Psych: pleasant, happy sounding, thought process linear Depression screen [facility] 2/9 08/23/2019 02/13/2019 10/14/2018  Decreased Interest 0 0 0  Down, Depressed, Hopeless 0 0 0  PHQ - 2 Score 0 0 0  Altered sleeping - - 0  Tired, decreased energy - - 0  Change in appetite - - 0  Feeling bad or failure about yourself  - - 0  Trouble concentrating - - 0  Moving slowly or fidgety/restless - - 0  Suicidal thoughts - - 0  PHQ-9 Score - - 0  Difficult doing work/chores - - Not difficult at all   GAD 7 : Generalized Anxiety Score 08/23/2019 02/13/2019 02/11/2018 09/15/2017  Nervous, Anxious, on Edge 1 0 0 0  Control/stop worrying 0 0 0 0  Worry too much - different things 0 0 0 0  Trouble relaxing 0 0 0 0  Restless 0 1 0 0  Easily annoyed or irritable 0 1 0 1  Afraid - awful might happen 0 0 0 0  Total GAD 7 Score 1 2 0  1  Anxiety Difficulty Not difficult at all Not difficult at all Not difficult at all -   Assessment/ Plan: 23 y.o. female   1. GAD (generalized anxiety disorder) Well-controlled.  Situational secondary to upcoming finals at school but otherwise seems to be doing excellent in [place] and is adjusting quite well.  Continue Zoloft.  I gone ahead and given her a years worth of refills due to COVID-19 and difficulty establishing face-to-face with a new provider in [place]. - sertraline (ZOLOFT) 50 MG tablet; Take 1 tablet (50 mg total) by mouth daily.  Dispense: 90 tablet; Refill: 3  2. Mixed obsessional thoughts and acts Controlled. - sertraline (ZOLOFT) 50  MG tablet; Take 1 tablet (50 mg total) by mouth daily.  Dispense: 90 tablet; Refill: 3  3. Seasonal allergic rhinitis due to pollen Controlled with Flonase and Allegra - fluticasone (FLONASE) 50 MCG/ACT nasal spray; Place 2 sprays into both nostrils daily.  Dispense: 48 mL; Refill: 2  4. Encounter for surveillance of contraceptive pills Stable.  Continue OCP.  Meds ordered this encounter  Medications  . sertraline (ZOLOFT) 50 MG tablet    Sig: Take 1 tablet (50 mg total) by mouth daily.    Dispense:  90 tablet    Refill:  3  . Norgestimate-Ethinyl Estradiol Triphasic (TRI-PREVIFEM) 0.18/0.215/0.25 MG-35 MCG tablet    Sig: Take 1 tablet by mouth daily.    Dispense:  84 tablet    Refill:  3  . fluticasone (FLONASE) 50 MCG/ACT nasal spray    Sig: Place 2 sprays into both nostrils daily.    Dispense:  48 mL    Refill:  2      Start time: 8:59am End time: 9:05am  Total time spent on patient care (including telephone call/ virtual visit): 12 minutes  [place], [place] [phone]

## 2019-09-26 ENCOUNTER — Ambulatory Visit: Payer: BC Managed Care – PPO | Attending: Internal Medicine

## 2019-09-26 ENCOUNTER — Other Ambulatory Visit: Payer: Self-pay

## 2019-09-26 DIAGNOSIS — Z20822 Contact with and (suspected) exposure to covid-19: Secondary | ICD-10-CM

## 2019-09-26 DIAGNOSIS — Z20828 Contact with and (suspected) exposure to other viral communicable diseases: Secondary | ICD-10-CM | POA: Diagnosis not present

## 2019-09-28 LAB — NOVEL CORONAVIRUS, NAA: SARS-CoV-2, NAA: NOT DETECTED

## 2019-10-03 ENCOUNTER — Telehealth: Payer: Self-pay | Admitting: Family Medicine

## 2019-10-03 ENCOUNTER — Other Ambulatory Visit: Payer: Self-pay | Admitting: Family Medicine

## 2019-10-03 MED ORDER — MOMETASONE FUROATE 0.1 % EX CREA: 1.0000 "application " | TOPICAL_CREAM | Freq: Every day | CUTANEOUS | 0 refills | Status: DC

## 2019-10-03 NOTE — Telephone Encounter (Signed)
Please advise, not on current med list.  

## 2019-10-04 ENCOUNTER — Ambulatory Visit: Payer: BC Managed Care – PPO | Attending: Internal Medicine

## 2019-10-04 DIAGNOSIS — Z20828 Contact with and (suspected) exposure to other viral communicable diseases: Secondary | ICD-10-CM | POA: Diagnosis not present

## 2019-10-04 DIAGNOSIS — Z20822 Contact with and (suspected) exposure to covid-19: Secondary | ICD-10-CM

## 2019-10-06 LAB — NOVEL CORONAVIRUS, NAA: SARS-CoV-2, NAA: NOT DETECTED

## 2019-10-13 ENCOUNTER — Ambulatory Visit: Payer: Managed Care, Other (non HMO) | Attending: Internal Medicine

## 2019-10-13 DIAGNOSIS — Z20822 Contact with and (suspected) exposure to covid-19: Secondary | ICD-10-CM

## 2019-10-14 LAB — NOVEL CORONAVIRUS, NAA: SARS-CoV-2, NAA: NOT DETECTED

## 2019-10-21 ENCOUNTER — Other Ambulatory Visit: Payer: Self-pay | Admitting: Family Medicine

## 2019-10-21 DIAGNOSIS — Z3041 Encounter for surveillance of contraceptive pills: Secondary | ICD-10-CM

## 2020-04-22 DIAGNOSIS — H04123 Dry eye syndrome of bilateral lacrimal glands: Secondary | ICD-10-CM | POA: Diagnosis not present

## 2020-04-22 DIAGNOSIS — H43393 Other vitreous opacities, bilateral: Secondary | ICD-10-CM | POA: Diagnosis not present

## 2020-05-20 ENCOUNTER — Other Ambulatory Visit: Payer: Self-pay

## 2020-05-20 ENCOUNTER — Encounter: Payer: Self-pay | Admitting: Family Medicine

## 2020-05-20 ENCOUNTER — Ambulatory Visit (INDEPENDENT_AMBULATORY_CARE_PROVIDER_SITE_OTHER): Payer: BC Managed Care – PPO | Admitting: Family Medicine

## 2020-05-20 VITALS — BP 90/55 | HR 59 | Temp 97.5°F | Ht 63.0 in | Wt 117.0 lb

## 2020-05-20 DIAGNOSIS — F422 Mixed obsessional thoughts and acts: Secondary | ICD-10-CM

## 2020-05-20 DIAGNOSIS — R5383 Other fatigue: Secondary | ICD-10-CM | POA: Diagnosis not present

## 2020-05-20 DIAGNOSIS — R633 Feeding difficulties: Secondary | ICD-10-CM

## 2020-05-20 DIAGNOSIS — Z0000 Encounter for general adult medical examination without abnormal findings: Secondary | ICD-10-CM

## 2020-05-20 DIAGNOSIS — R6339 Other feeding difficulties: Secondary | ICD-10-CM

## 2020-05-20 DIAGNOSIS — F411 Generalized anxiety disorder: Secondary | ICD-10-CM | POA: Diagnosis not present

## 2020-05-20 DIAGNOSIS — Z0001 Encounter for general adult medical examination with abnormal findings: Secondary | ICD-10-CM | POA: Diagnosis not present

## 2020-05-20 DIAGNOSIS — Z3041 Encounter for surveillance of contraceptive pills: Secondary | ICD-10-CM

## 2020-05-20 MED ORDER — SERTRALINE HCL 50 MG PO TABS: 50.0000 mg | ORAL_TABLET | Freq: Every day | ORAL | 3 refills | Status: DC

## 2020-05-20 MED ORDER — NORGESTIM-ETH ESTRAD TRIPHASIC 0.18/0.215/0.25 MG-35 MCG PO TABS: 1.0000 | ORAL_TABLET | Freq: Every day | ORAL | 3 refills | Status: DC

## 2020-05-20 NOTE — Patient Instructions (Signed)
Consider Nasacort instead of Flonase. Let me know if you need any additional documentation for your travels Send me a message when you are ready for Zoloft taper.  Preventive Care 70-23 Years Old, Female Preventive care refers to visits with your health care provider and lifestyle choices that can promote health and wellness. This includes:  A yearly physical exam. This may also be called an annual well check.  Regular dental visits and eye exams.  Immunizations.  Screening for certain conditions.  Healthy lifestyle choices, such as eating a healthy diet, getting regular exercise, not using drugs or products that contain nicotine and tobacco, and limiting alcohol use. What can I expect for my preventive care visit? Physical exam Your health care provider will check your:  Height and weight. This may be used to calculate body mass index (BMI), which tells if you are at a healthy weight.  Heart rate and blood pressure.  Skin for abnormal spots. Counseling Your health care provider may ask you questions about your:  Alcohol, tobacco, and drug use.  Emotional well-being.  Home and relationship well-being.  Sexual activity.  Eating habits.  Work and work Statistician.  Method of birth control.  Menstrual cycle.  Pregnancy history. What immunizations do I need?  Influenza (flu) vaccine  This is recommended every year. Tetanus, diphtheria, and pertussis (Tdap) vaccine  You may need a Td booster every 10 years. Varicella (chickenpox) vaccine  You may need this if you have not been vaccinated. Human papillomavirus (HPV) vaccine  If recommended by your health care provider, you may need three doses over 6 months. Measles, mumps, and rubella (MMR) vaccine  You may need at least one dose of MMR. You may also need a second dose. Meningococcal conjugate (MenACWY) vaccine  One dose is recommended if you are age 59-21 years and a first-year college student living in a  residence hall, or if you have one of several medical conditions. You may also need additional booster doses. Pneumococcal conjugate (PCV13) vaccine  You may need this if you have certain conditions and were not previously vaccinated. Pneumococcal polysaccharide (PPSV23) vaccine  You may need one or two doses if you smoke cigarettes or if you have certain conditions. Hepatitis A vaccine  You may need this if you have certain conditions or if you travel or work in places where you may be exposed to hepatitis A. Hepatitis B vaccine  You may need this if you have certain conditions or if you travel or work in places where you may be exposed to hepatitis B. Haemophilus influenzae type b (Hib) vaccine  You may need this if you have certain conditions. You may receive vaccines as individual doses or as more than one vaccine together in one shot (combination vaccines). Talk with your health care provider about the risks and benefits of combination vaccines. What tests do I need?  Blood tests  Lipid and cholesterol levels. These may be checked every 5 years starting at age 64.  Hepatitis C test.  Hepatitis B test. Screening  Diabetes screening. This is done by checking your blood sugar (glucose) after you have not eaten for a while (fasting).  Sexually transmitted disease (STD) testing.  BRCA-related cancer screening. This may be done if you have a family history of breast, ovarian, tubal, or peritoneal cancers.  Pelvic exam and Pap test. This may be done every 3 years starting at age 37. Starting at age 39, this may be done every 5 years if you have  a Pap test in combination with an HPV test. Talk with your health care provider about your test results, treatment options, and if necessary, the need for more tests. Follow these instructions at home: Eating and drinking   Eat a diet that includes fresh fruits and vegetables, whole grains, lean protein, and low-fat dairy.  Take vitamin  and mineral supplements as recommended by your health care provider.  Do not drink alcohol if: ? Your health care provider tells you not to drink. ? You are pregnant, may be pregnant, or are planning to become pregnant.  If you drink alcohol: ? Limit how much you have to 0-1 drink a day. ? Be aware of how much alcohol is in your drink. In the [place] one drink equals one 12 oz bottle of beer (355 mL), one 5 oz glass of wine (148 mL), or one 1 oz glass of hard liquor (44 mL). Lifestyle  Take daily care of your teeth and gums.  Stay active. Exercise for at least 30 minutes on 5 or more days each week.  Do not use any products that contain nicotine or tobacco, such as cigarettes, e-cigarettes, and chewing tobacco. If you need help quitting, ask your health care provider.  If you are sexually active, practice safe sex. Use a condom or other form of birth control (contraception) in order to prevent pregnancy and STIs (sexually transmitted infections). If you plan to become pregnant, see your health care provider for a preconception visit. What's next?  Visit your health care provider once a year for a well check visit.  Ask your health care provider how often you should have your eyes and teeth checked.  Stay up to date on all vaccines. This information is not intended to replace advice given to you by your health care provider. Make sure you discuss any questions you have with your health care provider. Document Revised: 06/02/2018 Document Reviewed: 06/02/2018 Elsevier Patient Education  2020 [employer].

## 2020-05-20 NOTE — Progress Notes (Signed)
Chelsea Fleming is a 24 y.o. female presents to office today for annual physical exam examination.    Concerns today include: 1. Fatigue She reports ongoing fatigue.  She wonders if this is related to Zoloft but would like to get vitamin tested.  She admits to picky eating where she does not eat much leafy greens or things that contain iron.  Does not report any weight fluctuations, tremor, heart palpitations or change in voice  Occupation: student, Marital status: single, Substance use: none Diet: picky eater (low vegetables, some chicken), Exercise: active Last pap smear: UTD Refills needed today: Zoloft, OCP Immunizations needed: UTD, getting [facility]. 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[employer]   Social History   Socioeconomic History   Marital status: Single    Spouse name: Not on file   Number of children: Not on file   Years of education: Not on file   Highest education level: Not on file  Occupational History   Not on file  Tobacco Use   Smoking status: Never Smoker   Smokeless tobacco: Never Used  Vaping Use   Vaping Use: Never used  Substance and Sexual Activity   Alcohol use: No   Drug use: No   Sexual activity: Yes    Partners: Male    Birth control/protection: Pill, Condom  Other Topics Concern   Not on file  Social History Narrative   Not on file   Social Determinants of Health   Financial Resource Strain:    Difficulty of Paying Living Expenses:   Food Insecurity:    Worried About Charity fundraiser in the Last Year:    Arboriculturist in the Last Year:   Transportation Needs:    Film/video editor (Medical):    Lack of Transportation (Non-Medical):   Physical Activity:    Days of Exercise per Week:    Minutes of Exercise per Session:   Stress:    Feeling of  Stress :   Social Connections:    Frequency of Communication with Friends and Family:    Frequency of Social Gatherings with Friends and Family:    Attends Religious Services:    Active Member of Clubs or Organizations:    Attends Music therapist:    Marital Status:   Intimate Partner Violence:    Fear of Current or Ex-Partner:    Emotionally Abused:    Physically Abused:    Sexually Abused:    History reviewed. No pertinent surgical history. Family History  Problem Relation Age of Onset   Depression Mother    Hyperlipidemia Mother    Hypertension Mother    Depression Sister    Heart disease Maternal Grandfather    Heart attack Maternal Grandfather 26   Diabetes Paternal Grandfather    Heart disease Paternal Grandfather    Hypertension Paternal Grandfather     Current Outpatient Medications:    fexofenadine (ALLEGRA) 180 MG tablet, One daily to twice daily For allergy symptoms, Disp: 60 tablet, Rfl: 11   fluticasone (FLONASE) 50 MCG/ACT nasal spray, Place 2 sprays into both nostrils daily., Disp: 48 mL, Rfl: 2   mometasone (ELOCON) 0.1 % cream, Apply 1 application topically daily. x7-10 days per eczema flare, Disp: 45 g, Rfl: 0   Multiple Vitamin (MULTIVITAMIN WITH MINERALS) TABS  tablet, Take 1 tablet by mouth daily., Disp: , Rfl:    Norgestimate-Ethinyl Estradiol Triphasic (TRI-PREVIFEM) 0.18/0.215/0.25 MG-35 MCG tablet, Take 1 tablet by mouth daily., Disp: 84 tablet, Rfl: 3   sertraline (ZOLOFT) 50 MG tablet, Take 1 tablet (50 mg total) by mouth daily., Disp: 90 tablet, Rfl: 3  Allergies  Allergen Reactions   Penicillin G Rash     ROS: Review of Systems Pertinent items noted in HPI and remainder of comprehensive ROS otherwise negative.    Physical exam BP (!) 90/55    Pulse (!) 59    Temp (!) 97.5 F (36.4 C)    Ht _0  (1.6 m)    Wt 117 lb (53.1 kg)    SpO2 100%    BMI 20.73 kg/m  General appearance: alert, cooperative,  appears stated age and no distress Head: Normocephalic, without obvious abnormality, atraumatic Eyes: negative findings: lids and lashes normal, conjunctivae and sclerae normal, corneas clear and pupils equal, round, reactive to light and accomodation Ears: normal TM's and external ear canals both ears Nose: Nares normal. Septum midline. Mucosa normal. No drainage or sinus tenderness. Throat: lips, mucosa, and tongue normal; teeth and gums normal Neck: no adenopathy, supple, symmetrical, trachea midline and thyroid not enlarged, symmetric, no tenderness/mass/nodules Back: symmetric, no curvature. ROM normal. No CVA tenderness. Lungs: clear to auscultation bilaterally Heart: regular rate and rhythm, S1, S2 normal, no murmur, click, rub or gallop Abdomen: soft, non-tender; bowel sounds normal; no masses,  no organomegaly Extremities: extremities normal, atraumatic, no cyanosis or edema Pulses: 2+ and symmetric Skin: Skin color, texture, turgor normal. No rashes or lesions Lymph nodes: Cervical, supraclavicular, and axillary nodes normal. Neurologic: Alert and oriented X 3, normal strength and tone. Normal symmetric reflexes. Normal coordination and gait Psych: Mood stable, speech normal, affect appropriate Depression screen [facility] 2/9 05/20/2020 08/23/2019 02/13/2019  Decreased Interest 0 0 0  Down, Depressed, Hopeless 0 0 0  PHQ - 2 Score 0 0 0  Altered sleeping - - -  Tired, decreased energy - - -  Change in appetite - - -  Feeling bad or failure about yourself  - - -  Trouble concentrating - - -  Moving slowly or fidgety/restless - - -  Suicidal thoughts - - -  PHQ-9 Score - - -  Difficult doing work/chores - - -    Assessment/ Plan: Chelsea Fleming here for annual physical exam.   1. Annual physical exam  2. Picky eater Check nonfasting labs - Lipid Panel - CMP14+EGFR  3. Fatigue, unspecified type - Ferritin - Vitamin B12 - TSH - CMP14+EGFR - CBC  4. GAD (generalized  anxiety disorder) Plan for taper when she returns from [place] - sertraline (ZOLOFT) 50 MG tablet; Take 1 tablet (50 mg total) by mouth daily.  Dispense: 90 tablet; Refill: 3  5. Mixed obsessional thoughts and acts Stable - sertraline (ZOLOFT) 50 MG tablet; Take 1 tablet (50 mg total) by mouth daily.  Dispense: 90 tablet; Refill: 3  6. Encounter for surveillance of contraceptive pills Stable.  Not currently sexually active - Norgestimate-Ethinyl Estradiol Triphasic (TRI-PREVIFEM) 0.18/0.215/0.25 MG-35 MCG tablet; Take 1 tablet by mouth daily.  Dispense: 84 tablet; Refill: 3   Handout on healthy lifestyle choices, including diet (rich in fruits, vegetables and lean meats and low in salt and simple carbohydrates) and exercise (at least 30 minutes of moderate physical activity daily).  Patient to follow up in 1 year for annual exam or sooner if needed.  Chelsea Fleming  M. Lajuana Ripple, DO

## 2020-05-21 LAB — VITAMIN B12: Vitamin B-12: 219 pg/mL — ABNORMAL LOW (ref 232–1245)

## 2020-05-21 LAB — CMP14+EGFR
ALT: 13 IU/L (ref 0–32)
AST: 16 IU/L (ref 0–40)
Albumin/Globulin Ratio: 1.6 (ref 1.2–2.2)
Albumin: 4.5 g/dL (ref 3.9–5.0)
Alkaline Phosphatase: 56 IU/L (ref 48–121)
BUN/Creatinine Ratio: 9 (ref 9–23)
BUN: 7 mg/dL (ref 6–20)
Bilirubin Total: 0.3 mg/dL (ref 0.0–1.2)
CO2: 21 mmol/L (ref 20–29)
Calcium: 8.6 mg/dL — ABNORMAL LOW (ref 8.7–10.2)
Chloride: 103 mmol/L (ref 96–106)
Creatinine, Ser: 0.76 mg/dL (ref 0.57–1.00)
GFR calc Af Amer: 128 mL/min/{1.73_m2} (ref >59)
GFR calc non Af Amer: 111 mL/min/{1.73_m2} (ref >59)
Globulin, Total: 2.8 g/dL (ref 1.5–4.5)
Glucose: 82 mg/dL (ref 65–99)
Potassium: 4.9 mmol/L (ref 3.5–5.2)
Sodium: 138 mmol/L (ref 134–144)
Total Protein: 7.3 g/dL (ref 6.0–8.5)

## 2020-05-21 LAB — CBC
Hematocrit: 40.4 % (ref 34.0–46.6)
Hemoglobin: 13.8 g/dL (ref 11.1–15.9)
MCH: 30.7 pg (ref 26.6–33.0)
MCHC: 34.2 g/dL (ref 31.5–35.7)
MCV: 90 fL (ref 79–97)
Platelets: 213 10*3/uL (ref 150–450)
RBC: 4.49 x10E6/uL (ref 3.77–5.28)
RDW: 11.2 % — ABNORMAL LOW (ref 11.7–15.4)
WBC: 4.8 10*3/uL (ref 3.4–10.8)

## 2020-05-21 LAB — FERRITIN: Ferritin: 60 ng/mL (ref 15–150)

## 2020-05-21 LAB — LIPID PANEL
Chol/HDL Ratio: 1.9 ratio (ref 0.0–4.4)
Cholesterol, Total: 143 mg/dL (ref 100–199)
HDL: 77 mg/dL (ref >39)
LDL Chol Calc (NIH): 55 mg/dL (ref 0–99)
Triglycerides: 51 mg/dL (ref 0–149)
VLDL Cholesterol Cal: 11 mg/dL (ref 5–40)

## 2020-05-21 LAB — TSH: TSH: 2.08 u[IU]/mL (ref 0.450–4.500)

## 2020-05-22 ENCOUNTER — Ambulatory Visit (INDEPENDENT_AMBULATORY_CARE_PROVIDER_SITE_OTHER): Payer: BC Managed Care – PPO | Admitting: *Deleted

## 2020-05-22 ENCOUNTER — Other Ambulatory Visit: Payer: Self-pay

## 2020-05-22 DIAGNOSIS — E538 Deficiency of other specified B group vitamins: Secondary | ICD-10-CM | POA: Diagnosis not present

## 2020-05-22 MED ORDER — CYANOCOBALAMIN 1000 MCG/ML IJ SOLN
1000.0000 ug | Freq: Once | INTRAMUSCULAR | Status: AC
Start: 2020-05-22 — End: 2020-05-22
  Administered 2020-05-22: 1000 ug via INTRAMUSCULAR

## 2020-05-22 NOTE — Progress Notes (Signed)
Vitamin b12 injection given and patient tolerated well.  

## 2020-07-14 ENCOUNTER — Other Ambulatory Visit: Payer: Self-pay | Admitting: Family Medicine

## 2020-07-14 DIAGNOSIS — Z3041 Encounter for surveillance of contraceptive pills: Secondary | ICD-10-CM

## 2020-08-15 ENCOUNTER — Encounter: Payer: Self-pay | Admitting: Family Medicine

## 2020-08-19 ENCOUNTER — Other Ambulatory Visit: Payer: Self-pay | Admitting: Family Medicine

## 2020-08-19 DIAGNOSIS — Z3041 Encounter for surveillance of contraceptive pills: Secondary | ICD-10-CM

## 2020-08-19 DIAGNOSIS — J301 Allergic rhinitis due to pollen: Secondary | ICD-10-CM

## 2020-08-19 DIAGNOSIS — F411 Generalized anxiety disorder: Secondary | ICD-10-CM

## 2020-08-19 DIAGNOSIS — F422 Mixed obsessional thoughts and acts: Secondary | ICD-10-CM

## 2020-08-19 MED ORDER — FLUTICASONE PROPIONATE 50 MCG/ACT NA SUSP: 2.0000 | Freq: Every day | NASAL | 2 refills | Status: AC

## 2020-08-19 MED ORDER — FEXOFENADINE HCL 180 MG PO TABS: 180.0000 mg | ORAL_TABLET | Freq: Every day | ORAL | 3 refills | Status: DC

## 2020-08-19 MED ORDER — MOMETASONE FUROATE 0.1 % EX CREA: 1.0000 "application " | TOPICAL_CREAM | Freq: Every day | CUTANEOUS | 0 refills | Status: DC

## 2020-08-19 MED ORDER — FEXOFENADINE HCL 180 MG PO TABS: 180.0000 mg | ORAL_TABLET | Freq: Every day | ORAL | 3 refills | Status: AC

## 2020-08-19 MED ORDER — SERTRALINE HCL 50 MG PO TABS: 50.0000 mg | ORAL_TABLET | Freq: Every day | ORAL | 3 refills | Status: DC

## 2020-08-19 MED ORDER — NORGESTIM-ETH ESTRAD TRIPHASIC 0.18/0.215/0.25 MG-35 MCG PO TABS: 1.0000 | ORAL_TABLET | Freq: Every day | ORAL | 3 refills | Status: AC

## 2020-09-20 ENCOUNTER — Encounter: Payer: Self-pay | Admitting: Family Medicine

## 2020-09-23 ENCOUNTER — Telehealth: Payer: Self-pay

## 2020-09-23 NOTE — Telephone Encounter (Signed)
  Prescription Request  09/23/2020  What is the name of the medication or equipment? Pt is going on hydroxazine for international travel  Have you contacted your pharmacy to request a refill? (if applicable) no  Which pharmacy would you like this sent to? [facility]   Patient notified that their request is being sent to the clinical staff for review and that they should receive a response within 2 business days.

## 2020-09-24 ENCOUNTER — Encounter: Payer: Self-pay | Admitting: Family Medicine

## 2020-09-25 ENCOUNTER — Other Ambulatory Visit: Payer: Self-pay | Admitting: Family Medicine

## 2020-09-25 MED ORDER — HYDROXYZINE HCL 50 MG PO TABS
25.0000 mg | ORAL_TABLET | Freq: Three times a day (TID) | ORAL | 3 refills | Status: AC | PRN
Start: 2020-09-25 — End: ?

## 2020-10-10 ENCOUNTER — Other Ambulatory Visit: Payer: Self-pay | Admitting: Family Medicine

## 2020-10-16 ENCOUNTER — Other Ambulatory Visit: Payer: Self-pay | Admitting: Internal Medicine

## 2020-10-16 ENCOUNTER — Other Ambulatory Visit: Payer: Managed Care, Other (non HMO)

## 2020-10-16 DIAGNOSIS — Z20822 Contact with and (suspected) exposure to covid-19: Secondary | ICD-10-CM

## 2020-10-18 LAB — NOVEL CORONAVIRUS, NAA: SARS-CoV-2, NAA: NOT DETECTED

## 2020-10-18 LAB — SARS-COV-2, NAA 2 DAY TAT

## 2020-10-18 LAB — SPECIMEN STATUS REPORT

## 2021-03-09 ENCOUNTER — Other Ambulatory Visit: Payer: Self-pay | Admitting: Family Medicine

## 2021-03-09 DIAGNOSIS — F422 Mixed obsessional thoughts and acts: Secondary | ICD-10-CM

## 2021-03-09 DIAGNOSIS — F411 Generalized anxiety disorder: Secondary | ICD-10-CM

## 2021-03-10 NOTE — Telephone Encounter (Signed)
OV 05/20/20 rtc 1 yr

## 2021-06-03 ENCOUNTER — Other Ambulatory Visit: Payer: Self-pay | Admitting: Family Medicine

## 2021-06-03 DIAGNOSIS — Z3041 Encounter for surveillance of contraceptive pills: Secondary | ICD-10-CM

## 2021-06-29 ENCOUNTER — Other Ambulatory Visit: Payer: Self-pay | Admitting: Family Medicine

## 2021-06-29 DIAGNOSIS — Z3041 Encounter for surveillance of contraceptive pills: Secondary | ICD-10-CM
